# Patient Record
Sex: Female | Born: 1935 | Race: White | Hispanic: No | Smoking: Former smoker
Health system: Southern US, Community
[De-identification: ages and names within clinical notes are randomized; demographics above are authoritative.]

## PROBLEM LIST (undated history)

## (undated) DIAGNOSIS — J45909 Unspecified asthma, uncomplicated: Secondary | ICD-10-CM

## (undated) DIAGNOSIS — I10 Essential (primary) hypertension: Secondary | ICD-10-CM

## (undated) DIAGNOSIS — H3530 Unspecified macular degeneration: Secondary | ICD-10-CM

## (undated) DIAGNOSIS — M810 Age-related osteoporosis without current pathological fracture: Secondary | ICD-10-CM

## (undated) DIAGNOSIS — M1990 Unspecified osteoarthritis, unspecified site: Secondary | ICD-10-CM

## (undated) DIAGNOSIS — G20 Parkinson's disease: Secondary | ICD-10-CM

## (undated) DIAGNOSIS — N189 Chronic kidney disease, unspecified: Secondary | ICD-10-CM

## (undated) DIAGNOSIS — G510 Bell's palsy: Secondary | ICD-10-CM

## (undated) DIAGNOSIS — K760 Fatty (change of) liver, not elsewhere classified: Secondary | ICD-10-CM

## (undated) HISTORY — DX: Bell's palsy: G51.0

## (undated) HISTORY — PX: EYE SURGERY: SHX253

## (undated) HISTORY — PX: APPENDECTOMY: SHX54

## (undated) HISTORY — DX: Fatty (change of) liver, not elsewhere classified: K76.0

## (undated) HISTORY — PX: CATARACT EXTRACTION: SUR2

## (undated) HISTORY — PX: ABDOMINAL HYSTERECTOMY: SHX81

## (undated) HISTORY — PX: ROTATOR CUFF REPAIR: SHX139

## (undated) HISTORY — PX: HERNIA REPAIR: SHX51

## (undated) HISTORY — DX: Unspecified asthma, uncomplicated: J45.909

## (undated) HISTORY — DX: Unspecified macular degeneration: H35.30

## (undated) HISTORY — PX: CHOLECYSTECTOMY: SHX55

---

## 1957-07-05 DIAGNOSIS — G510 Bell's palsy: Secondary | ICD-10-CM

## 1957-07-05 HISTORY — DX: Bell's palsy: G51.0

## 1999-04-02 ENCOUNTER — Ambulatory Visit (HOSPITAL_COMMUNITY): Admission: RE | Admit: 1999-04-02 | Discharge: 1999-04-02 | Payer: Self-pay | Admitting: *Deleted

## 1999-09-23 ENCOUNTER — Encounter: Admission: RE | Admit: 1999-09-23 | Discharge: 1999-09-23 | Payer: Self-pay | Admitting: *Deleted

## 1999-09-25 ENCOUNTER — Encounter: Payer: Self-pay | Admitting: Gastroenterology

## 1999-09-25 ENCOUNTER — Inpatient Hospital Stay (HOSPITAL_COMMUNITY): Admission: EM | Admit: 1999-09-25 | Discharge: 1999-09-28 | Payer: Self-pay | Admitting: *Deleted

## 1999-09-26 ENCOUNTER — Encounter: Payer: Self-pay | Admitting: Gastroenterology

## 2000-07-25 ENCOUNTER — Other Ambulatory Visit: Admission: RE | Admit: 2000-07-25 | Discharge: 2000-07-25 | Payer: Self-pay | Admitting: *Deleted

## 2000-07-26 ENCOUNTER — Ambulatory Visit (HOSPITAL_COMMUNITY): Admission: RE | Admit: 2000-07-26 | Discharge: 2000-07-26 | Payer: Self-pay | Admitting: *Deleted

## 2000-09-15 ENCOUNTER — Ambulatory Visit (HOSPITAL_COMMUNITY): Admission: RE | Admit: 2000-09-15 | Discharge: 2000-09-15 | Payer: Self-pay | Admitting: *Deleted

## 2000-10-27 ENCOUNTER — Emergency Department (HOSPITAL_COMMUNITY): Admission: EM | Admit: 2000-10-27 | Discharge: 2000-10-27 | Payer: Self-pay | Admitting: Emergency Medicine

## 2000-10-30 ENCOUNTER — Emergency Department (HOSPITAL_COMMUNITY): Admission: EM | Admit: 2000-10-30 | Discharge: 2000-10-30 | Payer: Self-pay | Admitting: Emergency Medicine

## 2001-08-22 ENCOUNTER — Ambulatory Visit (HOSPITAL_COMMUNITY): Admission: RE | Admit: 2001-08-22 | Discharge: 2001-08-22 | Payer: Self-pay | Admitting: *Deleted

## 2001-09-12 ENCOUNTER — Encounter: Admission: RE | Admit: 2001-09-12 | Discharge: 2001-09-12 | Payer: Self-pay | Admitting: *Deleted

## 2002-01-28 ENCOUNTER — Inpatient Hospital Stay (HOSPITAL_COMMUNITY): Admission: EM | Admit: 2002-01-28 | Discharge: 2002-01-31 | Payer: Self-pay

## 2002-07-09 ENCOUNTER — Ambulatory Visit: Admission: RE | Admit: 2002-07-09 | Discharge: 2002-07-09 | Payer: Self-pay | Admitting: Internal Medicine

## 2003-10-25 ENCOUNTER — Ambulatory Visit (HOSPITAL_COMMUNITY): Admission: RE | Admit: 2003-10-25 | Discharge: 2003-10-25 | Payer: Self-pay | Admitting: Internal Medicine

## 2003-11-14 ENCOUNTER — Other Ambulatory Visit: Admission: RE | Admit: 2003-11-14 | Discharge: 2003-11-14 | Payer: Self-pay | Admitting: Internal Medicine

## 2004-01-21 ENCOUNTER — Encounter (INDEPENDENT_AMBULATORY_CARE_PROVIDER_SITE_OTHER): Payer: Self-pay | Admitting: *Deleted

## 2004-01-21 ENCOUNTER — Ambulatory Visit (HOSPITAL_COMMUNITY): Admission: RE | Admit: 2004-01-21 | Discharge: 2004-01-21 | Payer: Self-pay | Admitting: General Surgery

## 2004-12-02 ENCOUNTER — Ambulatory Visit (HOSPITAL_COMMUNITY): Admission: RE | Admit: 2004-12-02 | Discharge: 2004-12-02 | Payer: Self-pay | Admitting: Internal Medicine

## 2006-11-18 ENCOUNTER — Ambulatory Visit (HOSPITAL_COMMUNITY): Admission: RE | Admit: 2006-11-18 | Discharge: 2006-11-18 | Payer: Self-pay | Admitting: Family Medicine

## 2007-06-28 ENCOUNTER — Ambulatory Visit (HOSPITAL_COMMUNITY): Admission: RE | Admit: 2007-06-28 | Discharge: 2007-06-28 | Payer: Self-pay | Admitting: Family Medicine

## 2007-07-10 ENCOUNTER — Ambulatory Visit (HOSPITAL_COMMUNITY): Admission: RE | Admit: 2007-07-10 | Discharge: 2007-07-10 | Payer: Self-pay | Admitting: Family Medicine

## 2007-09-12 ENCOUNTER — Ambulatory Visit (HOSPITAL_COMMUNITY): Admission: RE | Admit: 2007-09-12 | Discharge: 2007-09-13 | Payer: Self-pay | Admitting: General Surgery

## 2007-09-12 ENCOUNTER — Encounter (INDEPENDENT_AMBULATORY_CARE_PROVIDER_SITE_OTHER): Payer: Self-pay | Admitting: General Surgery

## 2008-02-22 ENCOUNTER — Emergency Department (HOSPITAL_COMMUNITY): Admission: EM | Admit: 2008-02-22 | Discharge: 2008-02-22 | Payer: Self-pay | Admitting: Emergency Medicine

## 2009-05-27 ENCOUNTER — Ambulatory Visit (HOSPITAL_COMMUNITY): Admission: RE | Admit: 2009-05-27 | Discharge: 2009-05-27 | Payer: Self-pay | Admitting: Family Medicine

## 2009-07-12 ENCOUNTER — Emergency Department (HOSPITAL_COMMUNITY): Admission: EM | Admit: 2009-07-12 | Discharge: 2009-07-12 | Payer: Self-pay | Admitting: Emergency Medicine

## 2010-10-07 ENCOUNTER — Encounter (HOSPITAL_COMMUNITY): Payer: Medicare Other

## 2010-11-17 NOTE — Op Note (Signed)
NAME:  Patricia Hopkins, Patricia Hopkins           ACCOUNT NO.:  [id number]   MEDICAL RECORD NO.:  [id number]          PATIENT TYPE:  OIB   LOCATION:  1535                         FACILITY:  [facility]   PHYSICIAN:  Adolph Pollack, M.D.DATE OF BIRTH:  [birth year]   DATE OF PROCEDURE:  09/12/2007  DATE OF DISCHARGE:  09/13/2007                               OPERATIVE REPORT   PREOPERATIVE DIAGNOSIS:  Biliary akinesia.   POSTOPERATIVE DIAGNOSES:  1. Biliary akinesia.  2. Hepatic fibrotic changes.   OPERATION/PROCEDURE:  1. Laparoscopic cholecystectomy.  2. Intraoperative cholangiogram.  3. Tru-Cut liver biopsy.   SURGEON:  Adolph Pollack, M.D.   ASSISTANT:  Anselm Pancoast. Zachery Dakins, M.D.   ANESTHESIA:  General.   INDICATIONS:  Mrs. Roundy is a 77 year old female who has had  intermittent episodes of right upper quadrant crampy pain radiating  around to the subscapular region after eating.  It seemed to occur more  after fatty or spicy foods.  She underwent an ultrasound which did not  demonstrate gallstones and she has a mildly dilated common bile duct,  but in 2003 she had an ultrasound.  This was also noted and unchanged.  She has some findings consistent with a fatty liver.  She has biliary  dyskinesia on her nuclear medicine gallbladder scan and thus presents  for elective cholecystectomy.  We have discussed the procedure, risks  and aftercare preoperatively.   TECHNIQUE:  She is seen in the holding area and brought to the operating  room, placed supine on the operating table and general anesthetic was  administered.  Her abdominal wall was sterilely prepped and draped.  Because of a previous umbilical hernia repair and lower midline  incision, a supraumbilical epigastric incision was made through the skin  and subcutaneous tissue and midline fascia.  Peritoneal cavity was then  entered under direct vision.  A Hassan trocar was introduced in the  peritoneal cavity and pneumoperitoneum  was created by insufflation of  CO2 gas.   Next, a 11 mm trocar was placed through an epigastric incision just  below the xiphoid process.  Two 5 mm trocars were placed in the right  upper quadrant.  She was placed in reverse Trendelenburg position, right  side tilted up.  On inspection of the liver, it had a micro nodular  appearance which appeared to be stiff and fibrotic.  The fundus of the  gallbladder was grasped and retracted toward the right shoulder.  There  were no inflammatory changes noted.  Using careful blunt dissection, I  began by mobilizing the infundibulum and retracted it laterally.  I  isolated the anterior branch of the cystic artery and created a window  around it.  It was clipped and divided.  I could then visualize the  cystic duct.  Using blunt dissection, I created a window around it.  She  had some abnormality of SGOT and SGPT.  A clip was placed at the  gallbladder cystic duct junction and small incision made in the cystic  duct.  Cholangiocatheter was passed through the anterior abdominal wall,  placed into the cystic duct and cholangiogram was performed.  Under real time fluoroscopy,  local contrast was injected into the  cystic duct which was of moderate to short length.  The common hepatic,  right and left hepatic, common bile ducts all filled.  Contrast drained  into the duodenum.  Common bile duct did appear to be slightly generous.  I did not see an obvious filling defect in the final reports pending  radiologist interpretation.   The cholangiocath was removed, the cystic duct was clipped three times  and staying inside divided.  Using blunt dissection, identified the  posterior branch of the cystic artery, created a window around it.  It  was clipped and divided.  I then dissected the gallbladder free from the  liver using electrocautery and placed it in an Endopouch bag.   Following this, I used the same small incision I used for the   cholangiocatheter, passed the Tru-Cut needle into the right lobe of  liver and perform a liver biopsy.  Sent this out to the pathology.  Bleeding was controlled electrocautery and piece of Surgicel was placed  over the site.   The gallbladder fossa was then copiously irrigated and no bile leak was  noted.   Then placed a piece of Surgicel on the gallbladder fossa.  The  gallbladder was then removed and the Endopouch bag through the inferior  epigastric port.  The fascial defect was closed by tightening up and  tying down the pursestring suture under laparoscopic vision.  The  remaining trocars were removed and pneumoperitoneum was released.   All skin incisions were closed with 4-0 Monocryl subcuticular stitches  followed by Steri-Strips and sterile dressings.  She tolerated procedure  without apparent complications was taken to recovery in satisfactory  condition.      Adolph Pollack, M.D.  Electronically Signed     TJR/MEDQ  D:  09/12/2007  T:  09/13/2007  Job:  [id number]   cc:   Stacie Acres. Cliffton Asters, M.D.  Fax: [phone]

## 2010-11-20 NOTE — Cardiovascular Report (Signed)
   NAME:  Patricia Hopkins, Patricia Hopkins                     ACCOUNT NO.:  [id number]   MEDICAL RECORD NO.:  [id number]                   PATIENT TYPE:  INP   LOCATION:  3738                                 FACILITY:  [facility]   PHYSICIAN:  Colleen Can. Deborah Chalk, M.D.            DATE OF BIRTH:  [birth year]   DATE OF PROCEDURE:  DATE OF DISCHARGE:  01/31/2002                              CARDIAC CATHETERIZATION   HISTORY:  [name] is a 77 year old female with a history of polycythemia  and obesity.  She presents with chest pain.  She has had asthma,  hypertension, and polycythemia.   PROCEDURE:  Left heart catheterization with selective coronary angiography.   TYPE AND SITE OF ENTRY:  Percutaneous subsequently noted to be in the  superficial femoral artery, Perclose not able to be performed.   CONTRAST:  Omnipaque.   MEDICATIONS GIVEN PRIOR TO PROCEDURE:  Valium 10 mg p.o.   MEDICATIONS GIVEN DURING PROCEDURE:  Versed 2 mg IV.   COMMENTS:  The patient tolerated the procedure well.   HEMODYNAMIC DATA:  The aortic pressure was 154/86, LV was 154/7.  There was  ventricular tachycardia noted at the time of pullback.  There was no aortic  valve gradient present during the case.   ANGIOGRAPHIC DATA:  1. The left main coronary artery is normal.  2. The left anterior descending has irregularities but is essentially     normal.  3. The intermediate coronary artery is normal.  4. Left circumflex:  Moderate-size vessel.  It is normal.  5. The right coronary artery is normal.   LEFT VENTRICULAR ANGIOGRAM:  The left ventricular angiogram is performed in  an RAO position.  Overall cardiac size and silhouette were normal.  Somewhat  hyperdynamic LV noted even prior to developing a short run of ventricular  tachycardia, which was catheter-induced.    OVERALL IMPRESSION:  1. Normal left ventricular function.  2. Minimal coronary atherosclerosis and essentially normal coronary      arteries.                                                 Colleen Can. Deborah Chalk, M.D.    SNT/MEDQ  D:  01/30/2002  T:  02/05/2002  Job:  [id number]   cc:   Pearletha Alfred, M.D.   Soyla Murphy. Renne Crigler, M.D.  Fax: [phone]

## 2010-11-20 NOTE — Discharge Summary (Signed)
NAME:  Patricia Hopkins, Patricia Hopkins                     ACCOUNT NO.:  [id number]   MEDICAL RECORD NO.:  [id number]                   PATIENT TYPE:  INP   LOCATION:  3738                                 FACILITY:  [facility]   PHYSICIAN:  Georgianne Fick, M.D.            DATE OF BIRTH:   DATE OF ADMISSION:  01/28/2002  DATE OF DISCHARGE:  01/31/2002                                 DISCHARGE SUMMARY   ADMISSION DIAGNOSES:  1. Chest pain.  2. Hyponatremia.  3. Probable sleep apnea.  4. Sunburn.  5. Rosacea.  6. Asthma.   DISCHARGE DIAGNOSES:  1. Chest pain/pressure, noncardiac in origin.  2. Asthmatic bronchitis with exacerbation.  3. Chronic issues, stable.   HOSPITAL COURSE:  After being admitted to [facility]  with complaints of chest pressure for approximately two days, the patient  has been free from an chest pain during her stay here.  She ruled out with  enzymes and had a cardiac catheterization carried out by [employer]. Deborah Chalk,  M.D.  This revealed minor coronary artery disease and as a result she will  be discharge home.   During her stay, she had worsening of her asthmatic bronchitis.  She was  started on treatment using albuterol/Atrovent nebulizers.  With this she  obtained remarkable improvement of her symptoms and thus will be discharged  on the nebulizer for home use.   LABORATORY DATA:  At the time of discharge, the comprehensive metabolic  panel revealed a sodium of 138, a potassium of 3.4, a BUN of 7, and a  creatinine of 0.7.  Liver function tests were grossly normal.  The CBC drawn  on January 30, 2002, revealed a white count of 8.0 with a hemoglobin of 15.1  and a platelet count of 231.   RADIOLOGICAL INVESTIGATIONS:  The chest x-ray taken on admission revealed  remote right-sided rib fracture with upper limits of heart size and mild  pulmonary vascular congestion.   PROCEDURES:  Cardiac catheterization carried out on January 30, 2002.  This  revealed normal LV function.  The only abnormality noted was LAD with  irregular minor stenosis.   DISCHARGE MEDICATIONS:  1. The patient will be discharged on all preadmission medications.  2. In addition, she will start nebulizer treatments using premixed     albuterol, as well as Atrovent t.i.d., as well as p.r.n. q.6h.  3. She is going to be taking Protonix 40 mg p.o. q.d.  4. Aveeno anti-itch cream to allergic rash on the chest plus over the count     Cortate.    DIET:  Low-fat, low-salt diet.   FOLLOW-UP:  The patient will follow up with myself in approximately four  weeks' time.  Georgianne Fick, M.D.    AR/MEDQ  D:  01/31/2002  T:  02/05/2002  Job:  [id number]

## 2010-11-20 NOTE — H&P (Signed)
NAME:  Patricia Hopkins, PHENIX                     ACCOUNT NO.:  [id number]   MEDICAL RECORD NO.:  [id number]                   PATIENT TYPE:  INP   LOCATION:  3738                                 FACILITY:  [facility]   PHYSICIAN:  Soyla Murphy. Renne Crigler, M.D.               DATE OF BIRTH:  January 16, 1932   DATE OF ADMISSION:  01/28/2002  DATE OF DISCHARGE:                                HISTORY & PHYSICAL   REASON FOR ADMISSION:  The patient is a 77 year old married white resident  of [place], who comes in with a chief complaint of chest pain.  She  describes two types of pain.  The first was sharp, intermittent, and began  about 11 a.m. on 7/27.  It occurred at rest, lasting a few seconds each.  About 8 p.m. tonight she developed chest pressure and shortness of breath  associated with clamminess and nausea.  Her husband brought her to the  emergency room.  She only has some slight chest pressure remaining.  Dr.  Deborah Chalk did a cardiac catheterization on her 12/95 with normal coronaries.  Again, the discomfort tonight occurred at rest.   PAST MEDICAL HISTORY:  Polycythemia followed by Dr. Donnie Coffin, rosacea, asthma,  and hypertension.   MEDICATIONS:  Albuterol two puffs four times a day p.r.n., HCTZ 25 mg p.o.  daily, fish oil capsules daily, aspirin 325 mg p.o. daily, Flonase one  squirt each nostril daily, doxycycline 100 mg daily p.r.n. rosacea.   ALLERGIES:  PENICILLIN, VALIUM, IBUPROFEN.  Intolerance to MORPHINE,  DEMEROL, IV DYE.   FAMILY HISTORY:  She has five children, alive and well.  Both parents had  coronary artery disease.   PERSONAL HISTORY:  She is retired.  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  No alcohol or  tobacco use.   REVIEW OF SYSTEMS:  She has had no skin changes.  She has occasional  headaches with intermittent left foot tingling over the past year, minimal.  Prior to today she has not had any chest pain,  shortness of breath,  orthopnea, PND.  She has a chronic intermittent edema that is no worse than  usual now.  There is no change in vision or hearing recently.  No sinus  pain, ear pain, sore throat, or runny nose.  She has had no recent cough or  palpitations.  No wheezing.  There is no hemoptysis, trouble swallowing,  nausea, vomiting, diarrhea, or rectal bleeding.  No urgency, frequency,  dysuria, or hematuria.  No vaginal bleeding or discharge.  She has had a  colonoscopy in the last couple of months.  No joint pain.  She tends to be  hotter than the average person and is not depressed.  She does snore loudly  and stops breathing periodically at night.   PHYSICAL EXAMINATION:  GENERAL:  She is comfortable-appearing, in no acute  distress.  VITAL  SIGNS:  Initial blood pressure is 206/87 with pulse of 91,  respirations 22, and O2 saturation of 97%.  Temperature is 98.4.  SKIN:  Redness of nose.  She has sunburn of the left shoulder.  LYMPHATIC:  There is no cervical, supraclavicular, axillary, or inguinal  adenopathy.  HEENT:  Normocephalic, atraumatic.  Conjunctivae are normal.  Pupils are  equal and reactive to light.  Normal tongue and posterior pharynx.  Normal  mucous membranes.  TMs are normal.  NECK:  Supple.  There is no thyromegaly or neck masses.  No carotid bruits.  CHEST:  Lungs were clear to auscultation.  PULSES:  2+ equal carotid, radial, dorsalis pedis, and 1+ equal posterior  tibial pulses bilaterally.  CARDIAC:  Regular rhythm.  No murmurs, rubs, or gallops.  No JVD, no edema.  ABDOMEN:  Nontender, bowel sounds present, no organomegaly, no masses.  BREASTS, PELVIC:  Not performed, declined by patient.  RECTAL:  She had agreed to a rectal exam and that showed normal rectal tone,  stool brown and heme-negative.  NEUROLOGIC:  She is alert and oriented x3.  Cranial nerves II-XII intact  except she is unable to abduct her right eye fully.  Normal speech.  No  tremors.   Gait is not tested.   IMPRESSION:  1. Chest pain, rule out myocardial infarction.  I think it is more likely     related to her asthma.  I will have Dr. Deborah Chalk see her again during the     hospital stay.  2. Asthma.  3. Hypertension, inadequate control.  Will add a second medication, Diovan,     to her regimen.  I am hesitant to give her a beta blocker given her     asthma.  4. Rosacea.  5. Sleep apnea by history.  She does have a positive family history of this     and probably will need a sleep study as an outpatient.  6. Polycythemia, followed by Dr. Donnie Coffin.  It is possible that this may also     improve with treatment of sleep apnea.  7. Allergic rhinitis.  8. Multiple allergies, see above.  9. Multiple medication intolerances, see above.                                               Soyla Murphy. Renne Crigler, M.D.    WDP/MEDQ  D:  01/29/2002  T:  01/31/2002  Job:  [id number]   cc:   Colleen Can. Deborah Chalk, M.D.   Georgianne Fick, M.D.  Fax: [phone]   Pierce Crane, M.D.

## 2010-11-20 NOTE — Procedures (Signed)
[facility]  Patient:    Patricia Hopkins, Patricia Hopkins                  MRN: [id number] Proc. Date: 09/15/00 Adm. Date:  [id number] Attending:  Sabino Gasser                           Procedure Report  PROCEDURE:  Colonoscopy.  INDICATION FOR PROCEDURE:  Colon polyps, constipation and colon cancer screening.  ANESTHESIA:  Demerol 120 mg, no Versed given.  DESCRIPTION OF PROCEDURE:  With the patient mildly sedated in the left lateral decubitus position, the Olympus videoscopic colonoscope was inserted in the rectum and passed under direct vision to the cecum identified by the ileocecal valve and appendiceal orifice both of which were photographed. From this point, the colonoscope was slowly withdrawn taking circumferential views of the entire colonic mucosa, stopping in the sigmoid area approximately 25 cm from the anal verge at which point a number of small polyps were seen, photographed and removed using hot biopsy forceps technique. The endoscope was withdrawn to the rectum which appeared normal in direct and retroflexed views. The endoscope was straightened and withdrawn. The patients vital signs and pulse oximeter remained stable. The patient tolerated the procedure well without apparent complications.  FINDINGS:  Small polyps of rectosigmoid area approximately 20-25 cm from the anal verge. Await biopsy report. The patient will call me for results and follow-up with me as an outpatient for this and other medical problems. DD:  09/15/00 TD:  09/15/00 Job: [id number]

## 2010-11-20 NOTE — Op Note (Signed)
NAME:  Patricia Hopkins, Patricia Hopkins                     ACCOUNT NO.:  [id number]   MEDICAL RECORD NO.:  [id number]                   PATIENT TYPE:  OIB   LOCATION:  2899                                 FACILITY:  [facility]   PHYSICIAN:  Adolph Pollack, M.D.            DATE OF BIRTH:  06-04-1932   DATE OF PROCEDURE:  01/21/2004  DATE OF DISCHARGE:                                 OPERATIVE REPORT   PREOPERATIVE DIAGNOSIS:  Umbilical hernia.   POSTOPERATIVE DIAGNOSIS:  Umbilical hernia.   PROCEDURE:  Umbilical hernia repair with mesh.   SURGEON:  Adolph Pollack, M.D.   ANESTHESIA:  General anesthesia.   INDICATIONS FOR PROCEDURE:  This 77 year old female has noted a bulge in the  umbilicus for some time but now she noticed it is starting to get larger and  becoming symptomatic.  She says sometimes it gets very protuberant and the  skin gets purple and then it goes down spontaneously.  She now presents for  elective repair.  The procedure and the risks were discussed with her  preoperatively.   DESCRIPTION OF PROCEDURE:  She was placed supine on the operating table and  general anesthetic was administered.  The periumbilical area was sterilely  prepped and draped.  There was 0.5% plain Marcaine used for local  anesthetic.  This was infiltrated in the periumbilical region.  A  curvilinear incision was made inferior to the umbilicus and carried down to  the subcutaneous tissue until the fascia was identified.  I then isolated  the umbilical area and the umbilical stalk.  I excised the hernia sac and  released the umbilicus from the fascia, exposing the defect.  I dissected  the subcutaneous tissue off the fascia for 3 to 4 cm around the defect.  I  excised some of the hernia sac.  I then primarily closed the defect with  interrupted 0 Surgilon sutures.  Subsequently a piece of polypropylene mesh  was placed as an onlay over the repair and the sutures used for the primary  repair were  threaded up toward it and tied down, anchoring the mesh to the  repair.  The periphery of the mesh was then anchored to the fascia with a  running 2-0 Vicryl suture. This provided for more than adequate coverage of  the defect as well as 3 to 4 cm of overlap.   I noted hemostasis was adequate.  I then injected the 0.5% plain Marcaine  local anesthetic into the fascial area.  I reattached the umbilicus to the  fascia with a single 3-0 Vicryl suture. The subcutaneous tissue was closed  over the fascia with a running 3-0 Vicryl suture.  The skin was closed with  a 4-0 Monocryl subcuticular stitch.  Steri-Strips and sterile dressings were  applied.   She tolerated the procedure well without any apparent complications.  She  subsequently was taken to the recovery room in satisfactory condition.  The  plan  will be to send her home later today with activity restrictions and  pain medication and see her back in the office in two to three weeks.                                               Adolph Pollack, M.D.    Kari Baars  D:  01/21/2004  T:  01/21/2004  Job:  [id number]   cc:   Georgianne Fick, M.D.  [address]  [place]  [place] [address]  Fax: [phone]

## 2011-03-29 LAB — COMPREHENSIVE METABOLIC PANEL
Albumin: 4.1
Alkaline Phosphatase: 67
BUN: 22
Chloride: 99
GFR calc Af Amer: >60
Glucose, Bld: 104 — ABNORMAL HIGH
Potassium: 3.8
Sodium: 136
Total Bilirubin: 0.8

## 2011-03-29 LAB — DIFFERENTIAL
Lymphocytes Relative: 33
Lymphs Abs: 2.4
Monocytes Relative: 10
Neutro Abs: 4
Neutrophils Relative %: 55

## 2011-03-29 LAB — CBC
HCT: 45.5
Hemoglobin: 15.7 — ABNORMAL HIGH
Platelets: 286
WBC: 7.4

## 2011-03-29 LAB — PROTIME-INR: Prothrombin Time: 13.4

## 2012-02-02 ENCOUNTER — Other Ambulatory Visit: Payer: Self-pay | Admitting: Family Medicine

## 2012-02-03 ENCOUNTER — Other Ambulatory Visit (HOSPITAL_COMMUNITY): Payer: Self-pay | Admitting: General Practice

## 2012-02-03 ENCOUNTER — Other Ambulatory Visit (HOSPITAL_COMMUNITY): Payer: Self-pay | Admitting: *Deleted

## 2012-02-11 ENCOUNTER — Other Ambulatory Visit (HOSPITAL_COMMUNITY): Payer: Self-pay | Admitting: *Deleted

## 2012-02-15 ENCOUNTER — Encounter (HOSPITAL_COMMUNITY): Payer: Self-pay

## 2012-02-15 ENCOUNTER — Encounter (HOSPITAL_COMMUNITY)
Admission: RE | Admit: 2012-02-15 | Discharge: 2012-02-15 | Disposition: A | Discharge status: Home / Self Care | Payer: Medicare Other | Source: Ambulatory Visit | Attending: Family Medicine | Admitting: Family Medicine

## 2012-02-15 DIAGNOSIS — M810 Age-related osteoporosis without current pathological fracture: Secondary | ICD-10-CM | POA: Insufficient documentation

## 2012-02-15 HISTORY — DX: Essential (primary) hypertension: I10

## 2012-02-15 HISTORY — DX: Age-related osteoporosis without current pathological fracture: M81.0

## 2012-02-15 HISTORY — DX: Chronic kidney disease, unspecified: N18.9

## 2012-02-15 HISTORY — DX: Unspecified osteoarthritis, unspecified site: M19.90

## 2012-02-15 MED ORDER — ZOLEDRONIC ACID 5 MG/100ML IV SOLN
5.0000 mg | Freq: Once | INTRAVENOUS | Status: AC
  Administered 2012-02-15: 5 mg via INTRAVENOUS
  Filled 2012-02-15: qty 100

## 2012-02-15 MED ORDER — SODIUM CHLORIDE 0.9 % IV SOLN: Freq: Once | INTRAVENOUS | Status: DC

## 2013-09-18 ENCOUNTER — Encounter (HOSPITAL_COMMUNITY): Payer: Self-pay | Admitting: Emergency Medicine

## 2013-09-18 ENCOUNTER — Emergency Department (HOSPITAL_COMMUNITY)
Admission: EM | Admit: 2013-09-18 | Discharge: 2013-09-18 | Disposition: A | Discharge status: Home / Self Care | Payer: Medicare Other | Source: Home / Self Care | Attending: Family Medicine | Admitting: Family Medicine

## 2013-09-18 DIAGNOSIS — R112 Nausea with vomiting, unspecified: Secondary | ICD-10-CM

## 2013-09-18 DIAGNOSIS — R197 Diarrhea, unspecified: Secondary | ICD-10-CM

## 2013-09-18 LAB — POCT I-STAT, CHEM 8
BUN: 42 mg/dL — AB (ref 6–23)
CALCIUM ION: 1.08 mmol/L — AB (ref 1.13–1.30)
Chloride: 105 mEq/L (ref 96–112)
Creatinine, Ser: 0.8 mg/dL (ref 0.50–1.10)
Glucose, Bld: 107 mg/dL — ABNORMAL HIGH (ref 70–99)
HCT: 50 % — ABNORMAL HIGH (ref 36.0–46.0)
Hemoglobin: 17 g/dL — ABNORMAL HIGH (ref 12.0–15.0)
Potassium: 3.5 mEq/L — ABNORMAL LOW (ref 3.7–5.3)
Sodium: 141 mEq/L (ref 137–147)
TCO2: 21 mmol/L (ref 0–100)

## 2013-09-18 MED ORDER — ONDANSETRON 4 MG PO TBDP
ORAL_TABLET | ORAL | Status: AC
  Filled 2013-09-18: qty 1

## 2013-09-18 MED ORDER — SODIUM CHLORIDE 0.9 % IV BOLUS (SEPSIS)
1000.0000 mL | Freq: Once | INTRAVENOUS | Status: AC
  Administered 2013-09-18: 1000 mL via INTRAVENOUS

## 2013-09-18 MED ORDER — ONDANSETRON 4 MG PO TBDP
8.0000 mg | ORAL_TABLET | Freq: Once | ORAL | Status: AC
  Administered 2013-09-18: 8 mg via ORAL

## 2013-09-18 MED ORDER — ONDANSETRON 4 MG PO TBDP: 4.0000 mg | ORAL_TABLET | Freq: Three times a day (TID) | ORAL | Status: DC | PRN

## 2013-09-18 NOTE — ED Notes (Signed)
Tolerating coke and chips.

## 2013-09-18 NOTE — ED Notes (Signed)
Sipping coke/ice chips

## 2013-09-18 NOTE — Discharge Instructions (Signed)
Nausea and Vomiting °Nausea means you feel sick to your stomach. Throwing up (vomiting) is a reflex where stomach contents come out of your mouth. °HOME CARE  °· Take medicine as told by your doctor. °· Do not force yourself to eat. However, you do need to drink fluids. °· If you feel like eating, eat a normal diet as told by your doctor. °· Eat rice, wheat, potatoes, bread, lean meats, yogurt, fruits, and vegetables. °· Avoid high-fat foods. °· Drink enough fluids to keep your pee (urine) clear or pale yellow. °· Ask your doctor how to replace body fluid losses (rehydrate). Signs of body fluid loss (dehydration) include: °· Feeling very thirsty. °· Dry lips and mouth. °· Feeling dizzy. °· Dark pee. °· Peeing less than normal. °· Feeling confused. °· Fast breathing or heart rate. °GET HELP RIGHT AWAY IF:  °· You have blood in your throw up. °· You have black or bloody poop (stool). °· You have a bad headache or stiff neck. °· You feel confused. °· You have bad belly (abdominal) pain. °· You have chest pain or trouble breathing. °· You do not pee at least once every 8 hours. °· You have cold, clammy skin. °· You keep throwing up after 24 to 48 hours. °· You have a fever. °MAKE SURE YOU:  °· Understand these instructions. °· Will watch your condition. °· Will get help right away if you are not doing well or get worse. °Document Released: 12/08/2007 Document Revised: 09/13/2011 Document Reviewed: 11/20/2010 °[employer]® Patient Information ©2014 [employer]. ° °

## 2013-09-18 NOTE — ED Provider Notes (Signed)
CSN: [id number]     Arrival date & time 09/18/13  1552 History   First MD Initiated Contact with Patient 09/18/13 1747     Chief Complaint  Patient presents with  . Diarrhea  . Emesis   (Consider location/radiation/quality/duration/timing/severity/associated sxs/prior Treatment) HPI  Patient is a 80 yo F with PMH of osteoporosis, HTN, CKD and astham presenting with 1 day history of vomiting and diarrhea. Started with nausea yesterday and then vomiting/diarrhea started at 5:00pm yesterday. [place]. She continues to have nausea with dry heaving. She is having watery diarrhea but it is slowing down. She is not eating or drinking. No bloody stool or emesis. She denies any sick contacts. She has not had any unusual foods except her breakfast at [employer] yesterday morning looked unusual. No recent travel. No new medications or antibiotics. No fevers. Increased fatigue. Denies abdominal pain, no body aches.   Past Medical History  Diagnosis Date  . Hypertension   . Chronic kidney disease   . Arthritis   . Osteoporosis    Past Surgical History  Procedure Laterality Date  . Abdominal hysterectomy    . Cholecystectomy    . Appendectomy    . Hernia repair    . Eye surgery    . Left rotator cuff repair     History reviewed. No pertinent family history. History  Substance Use Topics  . Smoking status: Never Smoker   . Smokeless tobacco: Former NeurosurgeonUser    Quit date: 08/04/1976  . Alcohol Use: No   OB History   Grav Para Term Preterm Abortions TAB SAB Ect Mult Living                 Review of Systems  Constitutional: Negative for fever and chills.  HENT: Negative for congestion.   Eyes: Negative for visual disturbance.  Respiratory: Negative for cough and shortness of breath.   Cardiovascular: Negative for chest pain and leg swelling.  Gastrointestinal: Positive for nausea, vomiting and diarrhea. Negative for abdominal pain.   Genitourinary: Negative for dysuria.  Musculoskeletal: Negative for arthralgias and myalgias.  Skin: Negative for rash.  Neurological: Negative for headaches.    Allergies  Ibuprofen; Cepacol; Crestor; Dual action complete; Fosamax; Penicillins; Pravachol; Valium; and Zocor  Home Medications   Current Outpatient Rx  Name  Route  Sig  Dispense  Refill  . albuterol (PROVENTIL) (2.5 MG/3ML) 0.083% nebulizer solution   Nebulization   Take 2.5 mg by nebulization every 6 (six) hours as needed.         Marland Kitchen. aspirin 81 MG tablet   Oral   Take 81 mg by mouth daily.         Marland Kitchen. ezetimibe (ZETIA) 10 MG tablet   Oral   Take 5 mg by mouth daily.         Marland Kitchen. ipratropium (ATROVENT) 0.02 % nebulizer solution   Nebulization   Take 500 mcg by nebulization 4 (four) times daily.         . metoprolol (LOPRESSOR) 50 MG tablet   Oral   Take 50 mg by mouth 2 (two) times daily.         . naproxen sodium (ANAPROX) 220 MG tablet   Oral   Take 220 mg by mouth 2 (two) times daily with a meal.         . nystatin (MYCOSTATIN/NYSTOP) 100000 UNIT/GM POWD   Topical   Apply topically.         .Marland Kitchen  ondansetron (ZOFRAN ODT) 4 MG disintegrating tablet   Oral   Take 1 tablet (4 mg total) by mouth every 8 (eight) hours as needed for nausea or vomiting.   20 tablet   0   . triamterene-hydrochlorothiazide (MAXZIDE-25) 37.5-25 MG per tablet   Oral   Take 1 tablet by mouth daily.         . zoledronic acid (RECLAST) 5 MG/100ML SOLN   Intravenous   Inject 5 mg into the vein once.          BP 158/96  Pulse 116  Temp(Src) 98.9 F (37.2 C) (Oral)  Resp 18  SpO2 96% Physical Exam  Constitutional: She is oriented to person, place, and time. She appears well-developed and well-nourished.  Appears uncomfortable  HENT:  Head: Normocephalic and atraumatic.  Tachy mucus membranes but tongue is not dry  Eyes: Pupils are equal, round, and reactive to light.  Neck: Normal range of motion. Neck  supple.  Cardiovascular: Normal rate, regular rhythm and normal heart sounds.   No murmur heard. Pulmonary/Chest: Effort normal. She has wheezes (In all lung fields).  Abdominal: Soft. She exhibits no distension. There is no tenderness.  Musculoskeletal: Normal range of motion. She exhibits no edema and no tenderness.  Neurological: She is alert and oriented to person, place, and time.  Skin: Skin is warm and dry. No rash noted. There is pallor.  Psychiatric: She has a normal mood and affect.    ED Course  Procedures (including critical care time) Labs Review Labs Reviewed  POCT I-STAT, CHEM 8 - Abnormal; Notable for the following:    Potassium 3.5 (*)    BUN 42 (*)    Glucose, Bld 107 (*)    Calcium, Ion 1.08 (*)    Hemoglobin 17.0 (*)    HCT 50.0 (*)    All other components within normal limits  I-STAT CHEM 8, ED   Imaging Review No results found.   MDM   1. Nausea vomiting and diarrhea   Viral vs. Ingestion.   7:50 PM Reviewed options with patient and family about going home with symptomatic care, receiving 1L bolus here vs going to ED for further work up and fluids. They have decided to try 1L bolus here and Zofran to see if she feels better. Zofran and IV fluids ordered. Will check iStat labs with IV start.  7:50 PM Patient currently receiving bolus. Improving symptomatically after zofran and IVF. Reviewed labs with her. Her Creat is normal. Her K+ is slightly low, likely due to GI loses. Her HgB is 17, but patient reports a history of polycythemia. Continue remainder of bolus, then reassess and do PO trial.   7:50 PM Much improved after 1L bolus and zofran. She is tolerating ice chips without nausea, and passed a PO challenge with liquids. Will d/c home in stable condition. Given Rx for Zofran 4mg  tabs. F/u with PCP, or return if she fails to improve.  Hilarie Fredrickson, MD 09/18/13 1950

## 2013-09-18 NOTE — ED Notes (Signed)
Diarrhea and vomiting that started last night around 6:00pm on 3/16.  Patient has been unable to hold anything down, and if vomiting, she was having a diarrhea stool at the same time.  Reports vomiting and diarrhea have slowed and in smaller amounts.  Reports currently with dry heaves

## 2013-09-19 NOTE — ED Provider Notes (Signed)
Medical screening examination/treatment/procedure(s) were performed by a resident physician or non-physician practitioner and as the supervising physician I was immediately available for consultation/collaboration.  Clementeen GrahamEvan Jinny Sweetland, MD    Rodolph BongEvan S Makhiya Coburn, MD 09/19/13 [phone]

## 2014-10-31 ENCOUNTER — Other Ambulatory Visit: Payer: Self-pay | Admitting: Sports Medicine

## 2014-10-31 DIAGNOSIS — M545 Low back pain: Secondary | ICD-10-CM

## 2014-11-10 ENCOUNTER — Ambulatory Visit
Admission: RE | Admit: 2014-11-10 | Discharge: 2014-11-10 | Disposition: A | Discharge status: Home / Self Care | Payer: Medicare Other | Source: Ambulatory Visit | Attending: Sports Medicine | Admitting: Sports Medicine

## 2014-11-10 DIAGNOSIS — M545 Low back pain: Secondary | ICD-10-CM

## 2015-03-28 ENCOUNTER — Ambulatory Visit
Admission: RE | Admit: 2015-03-28 | Discharge: 2015-03-28 | Disposition: A | Discharge status: Home / Self Care | Payer: Medicare Other | Source: Ambulatory Visit | Attending: Family Medicine | Admitting: Family Medicine

## 2015-03-28 ENCOUNTER — Other Ambulatory Visit: Payer: Self-pay | Admitting: Family Medicine

## 2015-03-28 DIAGNOSIS — M25552 Pain in left hip: Secondary | ICD-10-CM

## 2015-10-10 ENCOUNTER — Ambulatory Visit (INDEPENDENT_AMBULATORY_CARE_PROVIDER_SITE_OTHER): Payer: Medicare Other | Admitting: Neurology

## 2015-10-10 ENCOUNTER — Encounter: Payer: Self-pay | Admitting: Neurology

## 2015-10-10 ENCOUNTER — Ambulatory Visit: Payer: Medicare Other | Admitting: Neurology

## 2015-10-10 VITALS — BP 126/80 | HR 92 | Ht 60.0 in | Wt 114.0 lb

## 2015-10-10 DIAGNOSIS — G20 Parkinson's disease: Secondary | ICD-10-CM | POA: Diagnosis not present

## 2015-10-10 MED ORDER — CARBIDOPA-LEVODOPA 25-100 MG PO TABS: 1.0000 | ORAL_TABLET | Freq: Three times a day (TID) | ORAL | Status: DC

## 2015-10-10 NOTE — Patient Instructions (Addendum)
1. Start Carbidopa Levodopa as follows: 1/2 tab three times a day before meals x 1 wk, then 1/2 in am & noon & 1 in evening for a week, then 1/2 in am &1 at noon &one in evening for a week, then 1 tablet three times a day before meals. 2. Stop Atenolol

## 2015-10-10 NOTE — Progress Notes (Signed)
Patricia Hopkins was seen today in the movement disorders clinic for neurologic consultation at the request of WILLARD,JENNIFER, PA-C.  The consultation is for the evaluation of tremor of the face and hands and to rule out Parkinson's disease.  Tremor started in the mouth about 3 years ago and the hands started at the same time.  Pt insists that the R hand started before the L but the daughter states that both hands started at the same time.  They went to the doctor and were told that they were just "tremor."  The patient's sister has Parkinson's disease and she was worried about this.  When she was seen last week by the P.A., the daughter states that her provider was worried about ?cogwheel.  Tremor is noted both at rest and with use.  Specific Symptoms:  Tremor: Yes.  , as above.  Her primary care physician started her on atenolol, 25 mg, on 3/28.  May have started to help.  Affected by caffeine: unknown (2 cups coffee per day)  Affected by alcohol:  Unknown, doesn't drink alcohol  Affected by stress:  Yes.    Affected by fatigue:  Yes.    Spills soup if on spoon:  Yes.    Spills glass of liquid if full:  Yes.    Affects ADL's (tying shoes, brushing teeth, etc):  No.   Family hx of similar:  Yes.  , sister has PD Voice: hoarse voice Sleep: sleeps well per pt but daughter denies this  Vivid Dreams:  No.  Acting out dreams:  No. Wet Pillows: Yes.   Postural symptoms:  Yes.    Falls?  Yes.   (2 total falls, the last was a year ago - now uses walker) Bradykinesia symptoms: slow movements and difficulty getting out of a chair ([facility]); did have shuffling gait but she did PT for 8 weeks and is doing much better. Loss of smell:  No. Loss of taste:  No. Urinary Incontinence:  Yes.   Difficulty Swallowing:  No. Handwriting, micrographia: Yes.   Depression:  No. but daughter disagrees Memory changes:  No. but daughter disagrees; per prior notes, daughter  mentioned to PCP since at least 2016 ongoing memory loss.  MMSE in 2016 was 26/30; does own meals/meds.  Does not drive Hallucinations:  No.  visual distortions: No. N/V:  No. Lightheaded:  No.  Syncope: No. Diplopia:  Yes.  , vertical (unsure if monocular or binocular) Dyskinesia:  No.  Neuroimaging has not previously been performed.     ALLERGIES:   Allergies  Allergen Reactions  . Ibuprofen     Rash  . Cepacol [Cetylpyridinium]   . Crestor [Rosuvastatin]     No energy  . Dual Action Complete [Famotidine-Ca Carb-Mag Hydrox]   . Fosamax [Alendronate Sodium]     Nausea     Has taken Reclast  One other time did not have any problems  . Penicillins   . Pravachol [Pravastatin Sodium]   . Valium [Diazepam]     Affected her mind  . Zocor [Simvastatin]     CURRENT MEDICATIONS:  Outpatient Encounter Prescriptions as of 10/10/2015  Medication Sig  . albuterol (PROVENTIL) (2.5 MG/3ML) 0.083% nebulizer solution Take 2.5 mg by nebulization every 6 (six) hours as needed.  Marland Kitchen. aspirin 81 MG tablet Take 81 mg by mouth daily.  Marland Kitchen. atenolol (TENORMIN) 25 MG tablet Take 1 tablet by mouth daily.  Marland Kitchen. gabapentin (NEURONTIN) 100 MG capsule Take 100  mg by mouth 2 (two) times daily.  Marland Kitchen ipratropium (ATROVENT) 0.02 % nebulizer solution Take 500 mcg by nebulization 4 (four) times daily.  Marland Kitchen losartan-hydrochlorothiazide (HYZAAR) 100-25 MG tablet Take 1 tablet by mouth daily.  . naproxen sodium (ANAPROX) 220 MG tablet Take 220 mg by mouth 2 (two) times daily with a meal.  . nystatin (MYCOSTATIN/NYSTOP) 100000 UNIT/GM POWD Apply topically.  . vitamin B-12 (CYANOCOBALAMIN) 1000 MCG tablet Take 1,000 mcg by mouth daily.  . Vitamin D, Ergocalciferol, (DRISDOL) 50000 units CAPS capsule Take 1 capsule by mouth once a week.  . [DISCONTINUED] triamterene-hydrochlorothiazide (MAXZIDE-25) 37.5-25 MG per tablet Take 1 tablet by mouth daily.  . [DISCONTINUED] ezetimibe (ZETIA) 10 MG tablet Take 5 mg by mouth daily.  .  [DISCONTINUED] metoprolol (LOPRESSOR) 50 MG tablet Take 50 mg by mouth 2 (two) times daily.  . [DISCONTINUED] ondansetron (ZOFRAN ODT) 4 MG disintegrating tablet Take 1 tablet (4 mg total) by mouth every 8 (eight) hours as needed for nausea or vomiting.  . [DISCONTINUED] zoledronic acid (RECLAST) 5 MG/100ML SOLN Inject 5 mg into the vein once.   No facility-administered encounter medications on file as of 10/10/2015.    PAST MEDICAL HISTORY:   Past Medical History  Diagnosis Date  . Hypertension   . Chronic kidney disease   . Arthritis   . Osteoporosis   . Fatty liver   . Bell's palsy 1959    R side  . Macular degeneration   . Asthma     PAST SURGICAL HISTORY:   Past Surgical History  Procedure Laterality Date  . Abdominal hysterectomy    . Cholecystectomy    . Appendectomy    . Hernia repair      ventral  . Eye surgery Left   . Rotator cuff repair Left   . Cataract extraction Right     SOCIAL HISTORY:   Social History   Social History  . Marital Status: Widowed    Spouse Name: N/A  . Number of Children: N/A  . Years of Education: N/A   Occupational History  . Not on file.   Social History Main Topics  . Smoking status: Former Smoker    Quit date: 07/05/1976  . Smokeless tobacco: Former Neurosurgeon    Quit date: 08/04/1976  . Alcohol Use: No  . Drug Use: No  . Sexual Activity: No   Other Topics Concern  . Not on file   Social History Narrative    FAMILY HISTORY:   Family Status  Relation Status Death Age  . Mother Deceased     heart disease  . Father Deceased     heart disease  . Sister Deceased     13 siblings 2- cirrohosis  . Brother Alive     healthy  . Daughter Deceased     pulmonary fibrosis  . Daughter Alive     2, healthy  . Son Alive     2, healthy    ROS:  A complete 10 system review of systems was obtained and was unremarkable apart from what is mentioned above.  PHYSICAL EXAMINATION:    VITALS:   Filed Vitals:   10/10/15 0745    BP: 126/80  Pulse: 92  Height: 5' (1.524 m)  Weight: 114 lb (51.71 kg)    GEN:  The patient appears stated age and is in NAD. HEENT:  Normocephalic, atraumatic.  The mucous membranes are moist. The superficial temporal arteries are without ropiness or tenderness.  There is significant  chin tremor CV:  RRR Lungs:  CTAB Neck/HEME:  There are no carotid bruits bilaterally.  Neurological examination:  Orientation: The patient is alert and oriented x3. Fund of knowledge is appropriate.  Recent and remote memory are intact.  Attention and concentration are normal.    Able to name objects and repeat phrases. Cranial nerves: There is good facial symmetry except there is right eye twitching (? From old R bells and aberrant reinnervation). Pupils are equal round and reactive to light bilaterally. Fundoscopic exam is attempted but the disc margins are not well visualized bilaterally. Extraocular muscles are intact. The visual fields are full to confrontational testing. The speech is fluent and clear but she is hypophonic. Soft palate rises symmetrically and there is no tongue deviation. Hearing is intact to conversational tone. Sensation: Sensation is intact to light and pinprick throughout (facial, trunk, extremities). Vibration is intact at the bilateral big toe. There is no extinction with double simultaneous stimulation. There is no sensory dermatomal level identified. Motor: Strength is 5/5 in the bilateral upper and lower extremities.   Shoulder shrug is equal and symmetric.  There is no pronator drift. Deep tendon reflexes: Deep tendon reflexes are 2+-3-/4 at the bilateral biceps, triceps, brachioradialis, patella and achilles. Plantar responses are downgoing bilaterally.  Movement examination: Tone: There is mild increased tone in the LUE.  Tone on the R is normal Abnormal movements: There is R>LUE resting tremor that increases with distraction.  There is chin tremor Coordination:  There is  mild decremation with RAM's, seen with finger taps and toe taps on the L Gait and Station: The patient has  difficulty arising out of a deep-seated chair without the use of the hands and is unable to do so and ultimately requires help getting out of the chair. The patient's stride length is decreased and the right leg sticks to the floor and she turns en bloc.  She does much better when given a walker.     ASSESSMENT/PLAN:  1.  Parkinsonism.  I suspect that this does represent idiopathic Parkinson's disease.  The patient has tremor, bradykinesia, rigidity and postural instability.  -We discussed the diagnosis as well as pathophysiology of the disease.  We discussed treatment options as well as prognostic indicators.  Patient education was provided.  -Greater than 50% of the 60 minute visit was spent in counseling answering questions and talking about what to expect now as well as in the future.  We talked about medication options as well as potential future surgical options.  We talked about safety in the home.  -We decided to add carbidopa/levodopa 25/100.  1/2 tab tid x 1 wk, then 1/2 in am & noon & 1 at night for a week, then 1/2 in am &1 at noon &night for a week, then 1 po tid.  Risks, benefits, side effects and alternative therapies were discussed.  The opportunity to ask questions was given and they were answered to the best of my ability.  The patient expressed understanding and willingness to follow the outlined treatment protocols.  -She just finished PT and thinks that she did great with this and reports that she was shuffling much more before she started this.    -start exercise at the community where she lives safely (has access to chair exercise, biking)  -d/c atenolol as was given for tremor and is already on metoprolol and is c/o fatigue.    -May need to change rollator to traditional walker.  She was given ours  to try in the office and felt like she did better with this 2.  Follow up is  anticipated in the next few months, sooner should new neurologic issues arise.

## 2015-11-11 ENCOUNTER — Ambulatory Visit (INDEPENDENT_AMBULATORY_CARE_PROVIDER_SITE_OTHER): Payer: Medicare Other | Admitting: Internal Medicine

## 2015-11-11 ENCOUNTER — Encounter: Payer: Self-pay | Admitting: Internal Medicine

## 2015-11-11 ENCOUNTER — Ambulatory Visit: Payer: Medicare Other | Admitting: Cardiovascular Disease

## 2015-11-11 VITALS — BP 137/90 | HR 80 | Ht 60.0 in | Wt 114.6 lb

## 2015-11-11 DIAGNOSIS — R079 Chest pain, unspecified: Secondary | ICD-10-CM

## 2015-11-11 DIAGNOSIS — R5383 Other fatigue: Secondary | ICD-10-CM | POA: Diagnosis not present

## 2015-11-11 NOTE — Patient Instructions (Signed)

## 2015-11-11 NOTE — Progress Notes (Signed)
Cardiology Office Note   Date:  11/11/2015   ID:  Patricia NettleCharlotte F Delsanto, DOB [birth year], MRN [id number]  PCP:  Ronnie DossWILLARD,JENNIFER, PA-C  Cardiologist:   Dietrich PatesPaula Ross, MD   Pt referred by Dr Hyman HopesWebb for palpitations and CP      History of Present Illness: Patricia Hopkins is a 82 y.o. female with a history of worsening fatigue and CP  And palpitations  FHx of CAD  Pt referred by Dr Hyman HopesWebb Requests eval    Pt has been exhausted for several years, dating back to a fall she tood several yrs ago   Pt has had some heart fluttering  More when tired    Seen by Dr Tat  Gerlene FeePlaced on Sinemet  SInce then has noticed pressure on chest    Sleeps OK        Outpatient Prescriptions Prior to Visit  Medication Sig Dispense Refill  . albuterol (PROVENTIL) (2.5 MG/3ML) 0.083% nebulizer solution Take 2.5 mg by nebulization every 6 (six) hours as needed.    Marland Kitchen. aspirin 81 MG tablet Take 81 mg by mouth daily.    . carbidopa-levodopa (SINEMET IR) 25-100 MG tablet Take 1 tablet by mouth 3 (three) times daily. 270 tablet 1  . gabapentin (NEURONTIN) 100 MG capsule Take 100 mg by mouth 2 (two) times daily.    Marland Kitchen. ipratropium (ATROVENT) 0.02 % nebulizer solution Take 500 mcg by nebulization 4 (four) times daily.    Marland Kitchen. losartan-hydrochlorothiazide (HYZAAR) 100-25 MG tablet Take 1 tablet by mouth daily.  11  . naproxen sodium (ANAPROX) 220 MG tablet Take 220 mg by mouth 2 (two) times daily with a meal.    . nystatin (MYCOSTATIN/NYSTOP) 100000 UNIT/GM POWD Apply topically.    . vitamin B-12 (CYANOCOBALAMIN) 1000 MCG tablet Take 1,000 mcg by mouth daily.    . Vitamin D, Ergocalciferol, (DRISDOL) 50000 units CAPS capsule Take 1 capsule by mouth once a week.  5  . atenolol (TENORMIN) 25 MG tablet Take 1 tablet by mouth daily. Reported on 11/11/2015  3   No facility-administered medications prior to visit.     Allergies:   Ibuprofen; Cepacol; Crestor; Dual action complete; Fosamax; Penicillins; Pravachol; Valium; and Zocor     Past Medical History  Diagnosis Date  . Hypertension   . Chronic kidney disease   . Arthritis   . Osteoporosis   . Fatty liver   . Bell's palsy 1959    R side  . Macular degeneration   . Asthma     Past Surgical History  Procedure Laterality Date  . Abdominal hysterectomy    . Cholecystectomy    . Appendectomy    . Hernia repair      ventral  . Eye surgery Left   . Rotator cuff repair Left   . Cataract extraction Right      Social History:  The patient  reports that she quit smoking about 39 years ago. She quit smokeless tobacco use about 39 years ago. She reports that she does not drink alcohol or use illicit drugs.   Family History:  The patient's family history is not on file.    ROS:  Please see the history of present illness. All other systems are reviewed and  Negative to the above problem except as noted.    PHYSICAL EXAM: VS:  BP 137/90 mmHg  Pulse 80  Ht 5' (1.524 m)  Wt 114 lb 9.6 oz (51.982 kg)  BMI 22.38 kg/m2  GEN  Frail 83  yo  in no acute distress HEENT: normal Neck: no JVD, carotid bruits, or masses Cardiac: RRR; no murmurs, rubs, or gallops,no edema  Respiratory:  clear to auscultation bilaterally, normal work of breathing GI: soft, nontender, nondistended, + BS  No hepatomegaly   Skin: warm and dry, no rash Neuro:  Deferred Psych: euthymic mood, full affect   EKG:  EKG is ordered today.  SR 79  LAFB  Low voltage  Lipid Panel No results found for: CHOL, TRIG, HDL, CHOLHDL, VLDL, LDLCALC, LDLDIRECT    Wt Readings from Last 3 Encounters:  11/11/15 114 lb 9.6 oz (51.982 kg)  10/10/15 114 lb (51.71 kg)      ASSESSMENT AND PLAN:  1  Fatigue, SOB May represent deconditioning wht age in the setting of Parkinsons  I would recomm an echo to eval LV/RV function and pressues If LVEF is normal I would set up for lexiscan myovue to r/o ischemia    2.  Palpitatons  Follow for now  They do not sound hemodyn destabilizing  Isolated    F/U  based on test results      Signed, Dietrich Pates, MD  11/11/2015 11:13 AM    [facility] [address] [place], [place], [place]  [address] Phone: [phone]; Fax: [phone]

## 2015-11-24 ENCOUNTER — Encounter: Payer: Self-pay | Admitting: Internal Medicine

## 2015-11-26 ENCOUNTER — Other Ambulatory Visit: Payer: Self-pay

## 2015-11-26 ENCOUNTER — Ambulatory Visit (HOSPITAL_COMMUNITY): Payer: Medicare Other | Attending: Cardiovascular Disease

## 2015-11-26 DIAGNOSIS — I129 Hypertensive chronic kidney disease with stage 1 through stage 4 chronic kidney disease, or unspecified chronic kidney disease: Secondary | ICD-10-CM | POA: Diagnosis not present

## 2015-11-26 DIAGNOSIS — R5383 Other fatigue: Secondary | ICD-10-CM

## 2015-11-26 DIAGNOSIS — N189 Chronic kidney disease, unspecified: Secondary | ICD-10-CM | POA: Diagnosis not present

## 2015-11-26 DIAGNOSIS — R079 Chest pain, unspecified: Secondary | ICD-10-CM | POA: Diagnosis not present

## 2015-12-03 ENCOUNTER — Other Ambulatory Visit: Payer: Self-pay | Admitting: *Deleted

## 2015-12-03 DIAGNOSIS — R079 Chest pain, unspecified: Secondary | ICD-10-CM

## 2015-12-03 DIAGNOSIS — R5383 Other fatigue: Secondary | ICD-10-CM

## 2015-12-09 ENCOUNTER — Telehealth (HOSPITAL_COMMUNITY): Payer: Self-pay | Admitting: *Deleted

## 2015-12-09 NOTE — Telephone Encounter (Signed)
Left message on voicemail per DPR in reference to upcoming appointment scheduled on 12/11/15 at 0945 with detailed instructions given per Myocardial Perfusion Study Information Sheet for the test. LM to arrive 15 minutes early, and that it is imperative to arrive on time for appointment to keep from having the test rescheduled. If you need to cancel or reschedule your appointment, please call the office within 24 hours of your appointment. Failure to do so may result in a cancellation of your appointment, and a $50 no show fee. Phone number given for call back for any questions. Nataleigh Griffin, Adelene IdlerCynthia W

## 2015-12-11 ENCOUNTER — Ambulatory Visit (HOSPITAL_COMMUNITY): Payer: Medicare Other | Attending: Cardiovascular Disease

## 2015-12-11 DIAGNOSIS — R079 Chest pain, unspecified: Secondary | ICD-10-CM | POA: Diagnosis not present

## 2015-12-11 DIAGNOSIS — Z8249 Family history of ischemic heart disease and other diseases of the circulatory system: Secondary | ICD-10-CM | POA: Diagnosis not present

## 2015-12-11 DIAGNOSIS — R002 Palpitations: Secondary | ICD-10-CM | POA: Insufficient documentation

## 2015-12-11 DIAGNOSIS — R5383 Other fatigue: Secondary | ICD-10-CM | POA: Insufficient documentation

## 2015-12-11 LAB — MYOCARDIAL PERFUSION IMAGING
CHL CUP NUCLEAR SDS: 5
CHL CUP NUCLEAR SSS: 11
CHL CUP RESTING HR STRESS: 72 {beats}/min
LV sys vol: 5 mL
LVDIAVOL: 28 mL (ref 46–106)
NUC STRESS TID: 1.14
Peak HR: 100 {beats}/min
RATE: 0.28
SRS: 6

## 2015-12-11 MED ORDER — TECHNETIUM TC 99M TETROFOSMIN IV KIT
30.0000 | PACK | Freq: Once | INTRAVENOUS | Status: AC | PRN
  Administered 2015-12-11: 30 via INTRAVENOUS
  Filled 2015-12-11: qty 30

## 2015-12-11 MED ORDER — TECHNETIUM TC 99M TETROFOSMIN IV KIT
10.2000 | PACK | Freq: Once | INTRAVENOUS | Status: AC | PRN
  Administered 2015-12-11: 10 via INTRAVENOUS
  Filled 2015-12-11: qty 10

## 2015-12-11 MED ORDER — REGADENOSON 0.4 MG/5ML IV SOLN
0.4000 mg | Freq: Once | INTRAVENOUS | Status: AC
  Administered 2015-12-11: 0.4 mg via INTRAVENOUS

## 2015-12-24 ENCOUNTER — Ambulatory Visit (INDEPENDENT_AMBULATORY_CARE_PROVIDER_SITE_OTHER): Payer: Medicare Other | Admitting: Neurology

## 2015-12-24 ENCOUNTER — Encounter: Payer: Self-pay | Admitting: Neurology

## 2015-12-24 VITALS — BP 130/70 | HR 90 | Ht 61.0 in | Wt 112.0 lb

## 2015-12-24 DIAGNOSIS — G20 Parkinson's disease: Secondary | ICD-10-CM

## 2015-12-24 NOTE — Progress Notes (Signed)
Patricia Hopkins was seen today in the movement disorders clinic for neurologic consultation at the request of WEBB, CAROL D, MD.  The consultation is for the evaluation of tremor of the face and hands and to rule out Parkinson's disease.  Tremor started in the mouth about 3 years ago and the hands started at the same time.  Pt insists that the R hand started before the L but the daughter states that both hands started at the same time.  They went to the doctor and were told that they were just "tremor."  The patient's sister has Parkinson's disease and she was worried about this.  When she was seen last week by the P.A., the daughter states that her provider was worried about ?cogwheel.  Tremor is noted both at rest and with use.  12/24/15 update:  The patient was seen today in follow-up.  I diagnosed her with Parkinson's disease last visit and started her on levodopa.  Pts daughter states that it did nothing and "she still has the tremors really bad."  Admits that she is ablt to walk much better and farther than she was.  Her atenolol was discontinued since she was placed on that slowly for tremor and was already on a different beta blocker.  Daughter stopped the patients ASA due to bruising.  Has appt with PCP about this next week.   She denies any falls since last visit.  No lightheadedness or near syncope.  I did review records made available to me.  She had a stress Myoview on June 8 and this was normal, with an ejection fraction of 81%.   ALLERGIES:   Allergies  Allergen Reactions  . Ibuprofen     Rash  . Cepacol [Cetylpyridinium]   . Crestor [Rosuvastatin]     No energy  . Dual Action Complete [Famotidine-Ca Carb-Mag Hydrox]   . Fosamax [Alendronate Sodium]     Nausea     Has taken Reclast  One other time did not have any problems  . Penicillins   . Pravachol [Pravastatin Sodium]   . Valium [Diazepam]     Affected her mind  . Zocor [Simvastatin]     CURRENT MEDICATIONS:    Outpatient Encounter Prescriptions as of 12/24/2015  Medication Sig  . albuterol (PROVENTIL) (2.5 MG/3ML) 0.083% nebulizer solution Take 2.5 mg by nebulization every 6 (six) hours as needed.  . carbidopa-levodopa (SINEMET IR) 25-100 MG tablet Take 1 tablet by mouth 3 (three) times daily.  Marland Kitchen gabapentin (NEURONTIN) 100 MG capsule Take 100 mg by mouth 2 (two) times daily.  Marland Kitchen ipratropium (ATROVENT) 0.02 % nebulizer solution Take 500 mcg by nebulization 4 (four) times daily.  Marland Kitchen losartan-hydrochlorothiazide (HYZAAR) 100-25 MG tablet Take 1 tablet by mouth daily.  . naproxen sodium (ANAPROX) 220 MG tablet Take 220 mg by mouth 2 (two) times daily with a meal.  . nystatin (MYCOSTATIN/NYSTOP) 100000 UNIT/GM POWD Apply topically.  . vitamin B-12 (CYANOCOBALAMIN) 1000 MCG tablet Take 1,000 mcg by mouth daily.  . Vitamin D, Ergocalciferol, (DRISDOL) 50000 units CAPS capsule Take 1 capsule by mouth once a week.  . [DISCONTINUED] aspirin 81 MG tablet Take 81 mg by mouth daily.   No facility-administered encounter medications on file as of 12/24/2015.    PAST MEDICAL HISTORY:   Past Medical History  Diagnosis Date  . Hypertension   . Chronic kidney disease   . Arthritis   . Osteoporosis   . Fatty liver   . Bell's palsy  1959    R side  . Macular degeneration   . Asthma     PAST SURGICAL HISTORY:   Past Surgical History  Procedure Laterality Date  . Abdominal hysterectomy    . Cholecystectomy    . Appendectomy    . Hernia repair      ventral  . Eye surgery Left   . Rotator cuff repair Left   . Cataract extraction Right     SOCIAL HISTORY:   Social History   Social History  . Marital Status: Widowed    Spouse Name: N/A  . Number of Children: N/A  . Years of Education: N/A   Occupational History  . Not on file.   Social History Main Topics  . Smoking status: Former Smoker    Quit date: 07/05/1976  . Smokeless tobacco: Former Neurosurgeon    Quit date: 08/04/1976  . Alcohol Use: No  .  Drug Use: No  . Sexual Activity: No   Other Topics Concern  . Not on file   Social History Narrative    FAMILY HISTORY:   Family Status  Relation Status Death Age  . Mother Deceased     heart disease  . Father Deceased     heart disease  . Sister Deceased     13 siblings 2- cirrohosis  . Brother Alive     healthy  . Daughter Deceased     pulmonary fibrosis  . Daughter Alive     2, healthy  . Son Alive     2, healthy    ROS:  A complete 10 system review of systems was obtained and was unremarkable apart from what is mentioned above.  PHYSICAL EXAMINATION:    VITALS:   Filed Vitals:   12/24/15 1002  BP: 130/70  Pulse: 90  Height: 5\' 1"  (1.549 m)  Weight: 112 lb (50.803 kg)    GEN:  The patient appears stated age and is in NAD. HEENT:  Normocephalic, atraumatic.  The mucous membranes are moist. The superficial temporal arteries are without ropiness or tenderness.  There is significant chin tremor CV:  RRR Lungs:  CTAB Neck/HEME:  There are no carotid bruits bilaterally.  Neurological examination:  Orientation:  Montreal Cognitive Assessment  12/24/2015  Visuospatial/ Executive (0/5) 1  Naming (0/3) 2  Attention: Read list of digits (0/2) 1  Attention: Read list of letters (0/1) 1  Attention: Serial 7 subtraction starting at 100 (0/3) 3  Language: Repeat phrase (0/2) 2  Language : Fluency (0/1) 1  Abstraction (0/2) 2  Delayed Recall (0/5) 4  Orientation (0/6) 5  Total 22  Adjusted Score (based on education) 23    Cranial nerves: There is good facial symmetry except there is right eye twitching (? From old R bells and aberrant reinnervation).  The visual fields are full to confrontational testing. The speech is fluent and clear but she is hypophonic. Soft palate rises symmetrically and there is no tongue deviation. Hearing is intact to conversational tone. Sensation: Sensation is intact to light and pinprick throughout (facial, trunk, extremities).  Vibration is intact at the bilateral big toe. There is no extinction with double simultaneous stimulation. There is no sensory dermatomal level identified. Motor: Strength is 5/5 in the bilateral upper and lower extremities.   Shoulder shrug is equal and symmetric.  There is no pronator drift.  Movement examination: Tone: There is mild increased tone in the LUE.  Tone on the R is normal Abnormal movements: There is R>LUE  resting tremor that increases with distraction.  There is chin tremor Coordination:  There is mild decremation with RAM's, seen with finger taps and toe taps on the L Gait and Station: The patient has  difficulty arising out of a deep-seated chair without the use of the hands and is unable to do so and ultimately requires help getting out of the chair. The patient's stride length is good with her walker.    ASSESSMENT/PLAN:  1.  Idiopathic Parkinson's disease.  The patient has tremor, bradykinesia, rigidity and postural instability.  -Difficult to tell if med working as she didn't take her levodopa today.  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  Would continue this and she was agreeable.  Talked about fact that DA agonist may help tremor better but don't recommend starting it in this age group and she was agreeable.    -encouraged exercise 2.  Follow up is anticipated in the next few months, sooner should new neurologic issues arise.  Much greater than 50% of this visit was spent in counseling and coordinating care.  Total face to face time:  25 min

## 2016-01-18 ENCOUNTER — Encounter (HOSPITAL_BASED_OUTPATIENT_CLINIC_OR_DEPARTMENT_OTHER): Payer: Self-pay | Admitting: *Deleted

## 2016-01-18 ENCOUNTER — Emergency Department (HOSPITAL_BASED_OUTPATIENT_CLINIC_OR_DEPARTMENT_OTHER)
Admission: EM | Admit: 2016-01-18 | Discharge: 2016-01-18 | Disposition: A | Discharge status: Home / Self Care | Payer: Medicare Other | Attending: Emergency Medicine | Admitting: Emergency Medicine

## 2016-01-18 ENCOUNTER — Emergency Department (HOSPITAL_BASED_OUTPATIENT_CLINIC_OR_DEPARTMENT_OTHER): Payer: Medicare Other

## 2016-01-18 DIAGNOSIS — M1990 Unspecified osteoarthritis, unspecified site: Secondary | ICD-10-CM | POA: Diagnosis not present

## 2016-01-18 DIAGNOSIS — S42292A Other displaced fracture of upper end of left humerus, initial encounter for closed fracture: Secondary | ICD-10-CM | POA: Insufficient documentation

## 2016-01-18 DIAGNOSIS — S4992XA Unspecified injury of left shoulder and upper arm, initial encounter: Secondary | ICD-10-CM | POA: Diagnosis present

## 2016-01-18 DIAGNOSIS — N189 Chronic kidney disease, unspecified: Secondary | ICD-10-CM | POA: Diagnosis not present

## 2016-01-18 DIAGNOSIS — J45909 Unspecified asthma, uncomplicated: Secondary | ICD-10-CM | POA: Diagnosis not present

## 2016-01-18 DIAGNOSIS — Z79899 Other long term (current) drug therapy: Secondary | ICD-10-CM | POA: Insufficient documentation

## 2016-01-18 DIAGNOSIS — Y939 Activity, unspecified: Secondary | ICD-10-CM | POA: Diagnosis not present

## 2016-01-18 DIAGNOSIS — Y929 Unspecified place or not applicable: Secondary | ICD-10-CM | POA: Insufficient documentation

## 2016-01-18 DIAGNOSIS — M810 Age-related osteoporosis without current pathological fracture: Secondary | ICD-10-CM | POA: Insufficient documentation

## 2016-01-18 DIAGNOSIS — G20 Parkinson's disease: Secondary | ICD-10-CM | POA: Diagnosis not present

## 2016-01-18 DIAGNOSIS — Z87891 Personal history of nicotine dependence: Secondary | ICD-10-CM | POA: Diagnosis not present

## 2016-01-18 DIAGNOSIS — Y999 Unspecified external cause status: Secondary | ICD-10-CM | POA: Diagnosis not present

## 2016-01-18 DIAGNOSIS — I129 Hypertensive chronic kidney disease with stage 1 through stage 4 chronic kidney disease, or unspecified chronic kidney disease: Secondary | ICD-10-CM | POA: Diagnosis not present

## 2016-01-18 DIAGNOSIS — W1839XA Other fall on same level, initial encounter: Secondary | ICD-10-CM | POA: Insufficient documentation

## 2016-01-18 HISTORY — DX: Parkinson's disease: G20

## 2016-01-18 MED ORDER — MORPHINE SULFATE (PF) 4 MG/ML IV SOLN
3.0000 mg | Freq: Once | INTRAVENOUS | Status: AC
  Administered 2016-01-18: 3 mg via INTRAMUSCULAR
  Filled 2016-01-18: qty 1

## 2016-01-18 MED ORDER — HYDROCODONE-ACETAMINOPHEN 5-325 MG PO TABS: 1.0000 | ORAL_TABLET | ORAL | Status: DC | PRN

## 2016-01-18 NOTE — ED Provider Notes (Signed)
CSN: [id number]     Arrival date & time 01/18/16  1515 History  By signing my name below, I, Levon Hedger, attest that this documentation has been prepared under the direction and in the presence of Raeford Razor, MD . Electronically Signed: Levon Hedger, Scribe. 01/18/2016. 4:49 PM.   Chief Complaint  Patient presents with  . Shoulder Pain    The history is provided by the patient and a relative. No language interpreter was used.    HPI Comments:  Patricia Hopkins is a 82 y.o. female with PMHx of Parkinson Disease who presents to the Emergency Department complaining of sudden onset left shoulder pain with radiation to neck s/p fall at today at 11 am. Pt states she was "rushing to the bathroom" and was not using her walker when she fell. She states she took a Naprosyn with no relief.  Per daughter, pt is unable to tolerate most pain medication as it makes her "loopy". Pt is not currently on any blood thinners. She denies headache, chest pain, numbness, tingling, or nausea. No other complaints at this time.  Past Medical History  Diagnosis Date  . Hypertension   . Chronic kidney disease   . Arthritis   . Osteoporosis   . Fatty liver   . Bell's palsy 1959    R side  . Macular degeneration   . Asthma   . Parkinson disease [facility])    Past Surgical History  Procedure Laterality Date  . Abdominal hysterectomy    . Cholecystectomy    . Appendectomy    . Hernia repair      ventral  . Eye surgery Left   . Rotator cuff repair Left   . Cataract extraction Right    History reviewed. No pertinent family history. Social History  Substance Use Topics  . Smoking status: Former Smoker    Quit date: 07/05/1976  . Smokeless tobacco: Former Neurosurgeon    Quit date: 08/04/1976  . Alcohol Use: No   OB History    No data available     Review of Systems  Cardiovascular: Negative for chest pain.  Gastrointestinal: Negative for nausea.  Musculoskeletal: Positive for myalgias, arthralgias  and neck pain.  Neurological: Negative for numbness and headaches.  All other systems reviewed and are negative.   Allergies  Ibuprofen; Cepacol; Crestor; Dual action complete; Fosamax; Penicillins; Pravachol; Valium; and Zocor  Home Medications   Prior to Admission medications   Medication Sig Start Date End Date Taking? Authorizing Provider  sertraline (ZOLOFT) 25 MG tablet Take 25 mg by mouth daily.   Yes Historical Provider, MD  albuterol (PROVENTIL) (2.5 MG/3ML) 0.083% nebulizer solution Take 2.5 mg by nebulization every 6 (six) hours as needed.    Historical Provider, MD  carbidopa-levodopa (SINEMET IR) 25-100 MG tablet Take 1 tablet by mouth 3 (three) times daily. 10/10/15   Octaviano Batty Tat, DO  gabapentin (NEURONTIN) 100 MG capsule Take 100 mg by mouth 2 (two) times daily.    Historical Provider, MD  ipratropium (ATROVENT) 0.02 % nebulizer solution Take 500 mcg by nebulization 4 (four) times daily.    Historical Provider, MD  losartan-hydrochlorothiazide (HYZAAR) 100-25 MG tablet Take 1 tablet by mouth daily. 09/30/15   Historical Provider, MD  naproxen sodium (ANAPROX) 220 MG tablet Take 220 mg by mouth 2 (two) times daily with a meal.    Historical Provider, MD  nystatin (MYCOSTATIN/NYSTOP) 100000 UNIT/GM POWD Apply topically.    Historical Provider, MD  vitamin B-12 (CYANOCOBALAMIN) 1000  MCG tablet Take 1,000 mcg by mouth daily.    Historical Provider, MD  Vitamin D, Ergocalciferol, (DRISDOL) 50000 units CAPS capsule Take 1 capsule by mouth once a week. 09/30/15   Historical Provider, MD   BP 136/79 mmHg  Pulse 83  Temp(Src) 98 F (36.7 C) (Oral)  Resp 18  Ht 5\' 1"  (1.549 m)  Wt 110 lb (49.896 kg)  BMI 20.80 kg/m2  SpO2 96% Physical Exam  Constitutional: She is oriented to person, place, and time. She appears well-developed and well-nourished. No distress.  HENT:  Head: Normocephalic and atraumatic.  Eyes: Conjunctivae are normal.  Cardiovascular: Normal rate.    Pulmonary/Chest: Effort normal.  Abdominal: She exhibits no distension.  Musculoskeletal: She exhibits tenderness.  Tenderness and deformity to proximal humerus Closed injury NVI  Neurological: She is alert and oriented to person, place, and time.  Skin: Skin is warm and dry.  Surgical scar left shoulder  Psychiatric: She has a normal mood and affect.  Nursing note and vitals reviewed.   ED Course  Procedures  DIAGNOSTIC STUDIES:  Oxygen Saturation is 98% on RA, normal by my interpretation.    COORDINATION OF CARE:  4:00 PM Will order morphine 4 Mg. Discussed treatment plan with pt at bedside and pt agreed to plan.  Labs Review Labs Reviewed - No data to display  Imaging Review Dg Shoulder Left  01/18/2016  CLINICAL DATA:  Fall today with injury to left shoulder. EXAM: LEFT SHOULDER - 2+ VIEW COMPARISON:  None. FINDINGS: Three-view exam left shoulder shows a comminuted humeral head fracture with a probable degree of impaction. The acromioclavicular and coracoclavicular distances are preserved. Bones are diffusely demineralized. IMPRESSION: Comminuted fracture humeral head. Electronically Signed   By: Kennith CenterEric  Mansell M.D.   On: 01/18/2016 16:19   I have personally reviewed and evaluated these images and lab results as part of my medical decision-making.   EKG Interpretation None      MDM   Final diagnoses:  Humeral head fracture, left, closed, initial encounter    83yF with L shoulder pain after mechanical fall. Imaging significant for L humeral head fx. Closed injury. NVI. Sling. PRN pain meds. Ortho FU. It has been determined that no acute conditions requiring further emergency intervention are present at this time. The patient has been advised of the diagnosis and plan. I reviewed any labs and imaging including any potential incidental findings. We have discussed signs and symptoms that warrant return to the ED and they are listed in the discharge instructions.   I  personally preformed the services scribed in my presence. The recorded information has been reviewed is accurate. Raeford RazorStephen Marcin Holte, MD.     Raeford RazorStephen England Greb, MD 01/18/16 [phone]

## 2016-01-18 NOTE — Discharge Instructions (Signed)
°Shoulder Fracture (Proximal Humerus or Glenoid) °A shoulder fracture is a broken upper arm bone or a broken socket bone. The humerus is the upper arm bone and the glenoid is the shoulder socket. Proximal means the humerus is broken near the shoulder. Most of the time the bones of a broken shoulder are in an acceptable position. Usually, the injury can be treated with a shoulder immobilizer or sling and swath bandage. These devices support the arm and prevent any shoulder movement. If the bones are not in a good position, then surgery is sometimes needed. Shoulder fractures usually initially cause swelling, pain, and discoloration around the upper arm. They heal in 8 to 12 weeks with proper treatment. °SYMPTOMS  °At the time of injury: °· Pain. °· Tenderness. °· Regular body contours are not normal. °Later symptoms may include: °· Swelling and bruising of the elbow and hand. °· Swelling and bruising of the arm or chest. °Other symptoms include: °· Pain when lifting or turning the arm. °· Paralysis below the fracture. °· Numbness or coldness below the fracture. °CAUSES  °· Indirect force from falling on an outstretched arm. °· A blow to the shoulder. °RISK INCREASES WITH: °· Not being in shape. °· Playing contact sports, such as football, soccer, hockey, or rugby. °· Sports where falling on an outstretched arm occurs, such as basketball, skateboarding, or volleyball. °· History of bone or joint disease. °· History of shoulder injury. °PREVENTION °· Warm up before activity. °· Stretch before activity. °· Stay in shape with your: °¨ Heart fitness. °¨ Flexibility. °¨ Shoulder Strength. °· Falling with the proper technique. °PROGNOSIS  °In adults, healing time is about 7 weeks. For children, healing time is about 5 weeks. Surgery may be needed. °RELATED COMPLICATIONS °· The bones do not heal together (nonunion). °· The bones do not align properly when they heal (malunion). °· Long-term problems with pain, stiffness,  swelling, or loss of motion. °· The injured arm heals shorter than the other. °· Nerves are injured in the arm. °· Arthritis in the shoulder. °· Normal bone growth is interrupted in children. °· Blood supply to the shoulder joint is diminished. °TREATMENT °If the bones are aligned, then initial treatment will be with ice and medicine to help with pain. The shoulder will be held in place with a sling (immobilization). The shoulder will be allowed to heal for up to 6 weeks. Injuries that may need surgery include: °· Severe fractures. °· Fractures that are not in appropriate alignment (displaced). °· Non-displaced fractures (not common). °Surgery helps the bones align correctly. The bones may be held in place with: °· Sutures. °· Wires. °· Rods. °· Plates. °· Screws. °· Pins. °If you have had surgery or not, you will likely be assisted by a physical therapist or athletic trainer to get the best results with your injured shoulder. This will likely include exercises to strengthen and stretch the injured and surrounding areas. °MEDICATION °· If pain medicine is needed, nonsteroidal anti-inflammatory medicines (such as aspirin or ibuprofen) or other minor pain relievers (such as acetaminophen) are often advised. °· Do not take pain medicine for 7 days before surgery. °· Stronger pain relievers may be prescribed. Use only as directed and take only as much as you need. °COLD THERAPY °Cold treatment (icing) relieves pain and reduces inflammation. Cold treatment should be applied for 10 to 15 minutes every 2 to 3 hours, and immediately after activity that aggravates your symptoms. Use ice packs or an ice massage. °SEEK IMMEDIATE   MEDICAL CARE IF: °· You have severe shoulder pain unrelieved by rest and taking pain medicine. °· You have pain, numbness, tingling, or weakness in the hand or wrist. °· You have shortness of breath, chest pain, severe weakness, or fainting. °· You have severe pain with motion of the fingers or  wrist. °· Blue, gray, or dark color appears in the fingernails on injured extremity. °  °This information is not intended to replace advice given to you by your health care provider. Make sure you discuss any questions you have with your health care provider. °  °Document Released: 06/21/2005 Document Revised: 09/13/2011 Document Reviewed: 10/03/2008 °Elsevier Interactive Patient Education ©2016 [employer] ° °

## 2016-01-18 NOTE — ED Notes (Signed)
Left shoulder pain since falling today.  Cms intact.

## 2016-04-04 ENCOUNTER — Other Ambulatory Visit: Payer: Self-pay | Admitting: Neurology

## 2016-04-26 ENCOUNTER — Ambulatory Visit: Payer: Medicare Other | Admitting: Neurology

## 2016-05-04 NOTE — Progress Notes (Signed)
Patricia Hopkins was seen today in the movement disorders clinic for neurologic consultation at the request of WEBB, CAROL D, MD.  The consultation is for the evaluation of tremor of the face and hands and to rule out Parkinson's disease.  Tremor started in the mouth about 3 years ago and the hands started at the same time.  Pt insists that the R hand started before the L but the daughter states that both hands started at the same time.  They went to the doctor and were told that they were just "tremor."  The patient's sister has Parkinson's disease and she was worried about this.  When she was seen last week by the P.A., the daughter states that her provider was worried about ?cogwheel.  Tremor is noted both at rest and with use.  12/24/15 update:  The patient was seen today in follow-up.  I diagnosed her with Parkinson's disease last visit and started her on levodopa.  Pts daughter states that it did nothing and "she still has the tremors really bad."  Admits that she is ablt to walk much better and farther than she was.  Her atenolol was discontinued since she was placed on that slowly for tremor and was already on a different beta blocker.  Daughter stopped the patients ASA due to bruising.  Has appt with PCP about this next week.   She denies any falls since last visit.  No lightheadedness or near syncope.  I did review records made available to me.  She had a stress Myoview on June 8 and this was normal, with an ejection fraction of 81%.  05/05/16 update:  The patient is a history of Parkinson's disease.  Daughter accompanies her and supplements the history.  She is on carbidopa/levodopa 25/100, one tablet 3 times per day.  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  She had 2-3 falls since last visit.  Daughter states that had fx of arm but told no intervention other than sling.  She wasn't using the walker when she fell.  She states that the phone rang and she didn't grab  the walker and fell.  Cannot tell me about other falls.  Was supposed to do balance PT but didn't like the person who came out and didn't call back.  That was 2 years ago.   No lightheadedness or near syncope.   ALLERGIES:   Allergies  Allergen Reactions  . Ibuprofen     Rash  . Cepacol [Cetylpyridinium]   . Crestor [Rosuvastatin]     No energy  . Dual Action Complete [Famotidine-Ca Carb-Mag Hydrox]   . Fosamax [Alendronate Sodium]     Nausea     Has taken Reclast  One other time did not have any problems  . Penicillins   . Pravachol [Pravastatin Sodium]   . Valium [Diazepam]     Affected her mind  . Zocor [Simvastatin]     CURRENT MEDICATIONS:  Outpatient Encounter Prescriptions as of 05/05/2016  Medication Sig  . albuterol (PROVENTIL) (2.5 MG/3ML) 0.083% nebulizer solution Take 2.5 mg by nebulization every 6 (six) hours as needed.  . carbidopa-levodopa (SINEMET IR) 25-100 MG tablet TAKE 1 TABLET BY MOUTH THREE TIMES DAILY  . gabapentin (NEURONTIN) 100 MG capsule Take 100 mg by mouth at bedtime.   Marland Kitchen. ipratropium (ATROVENT) 0.02 % nebulizer solution Take 500 mcg by nebulization 4 (four) times daily.  Marland Kitchen. losartan-hydrochlorothiazide (HYZAAR) 100-25 MG tablet Take 1 tablet by mouth daily.  .Marland Kitchen  naproxen sodium (ANAPROX) 220 MG tablet Take 220 mg by mouth 2 (two) times daily with a meal.  . vitamin B-12 (CYANOCOBALAMIN) 1000 MCG tablet Take 1,000 mcg by mouth daily.  . Vitamin D, Ergocalciferol, (DRISDOL) 50000 units CAPS capsule Take 1 capsule by mouth once a week.  . [DISCONTINUED] sertraline (ZOLOFT) 25 MG tablet Take 25 mg by mouth daily.  . sertraline (ZOLOFT) 50 MG tablet   . [DISCONTINUED] HYDROcodone-acetaminophen (NORCO/VICODIN) 5-325 MG tablet Take 1 tablet by mouth every 4 (four) hours as needed.  . [DISCONTINUED] nystatin (MYCOSTATIN/NYSTOP) 100000 UNIT/GM POWD Apply topically.   No facility-administered encounter medications on file as of 05/05/2016.     PAST MEDICAL  HISTORY:   Past Medical History:  Diagnosis Date  . Arthritis   . Asthma   . Bell's palsy 1959   R side  . Chronic kidney disease   . Fatty liver   . Hypertension   . Macular degeneration   . Osteoporosis   . Parkinson disease (HCC)     PAST SURGICAL HISTORY:   Past Surgical History:  Procedure Laterality Date  . ABDOMINAL HYSTERECTOMY    . APPENDECTOMY    . CATARACT EXTRACTION Right   . CHOLECYSTECTOMY    . EYE SURGERY Left   . HERNIA REPAIR     ventral  . ROTATOR CUFF REPAIR Left     SOCIAL HISTORY:   Social History   Social History  . Marital status: Widowed    Spouse name: N/A  . Number of children: N/A  . Years of education: N/A   Occupational History  . Not on file.   Social History Main Topics  . Smoking status: Former Smoker    Quit date: 07/05/1976  . Smokeless tobacco: Former NeurosurgeonUser    Quit date: 08/04/1976  . Alcohol use No  . Drug use: No  . Sexual activity: No   Other Topics Concern  . Not on file   Social History Narrative  . No narrative on file    FAMILY HISTORY:   Family Status  Relation Status  . Mother Deceased   heart disease  . Father Deceased   heart disease  . Sister Deceased   13 siblings 2- cirrohosis  . Brother Alive   healthy  . Daughter Deceased   pulmonary fibrosis  . Daughter Alive   2, healthy  . Son Alive   2, healthy    ROS:  A complete 10 system review of systems was obtained and was unremarkable apart from what is mentioned above.  PHYSICAL EXAMINATION:    VITALS:   Vitals:   05/05/16 1103  BP: 132/84  Pulse: 95  SpO2: 97%    GEN:  The patient appears stated age and is in NAD. HEENT:  Normocephalic, atraumatic.  The mucous membranes are moist. The superficial temporal arteries are without ropiness or tenderness.  There is significant chin tremor CV:  RRR Lungs:  CTAB Neck/HEME:  There are no carotid bruits bilaterally.  Neurological examination:  Orientation:  Montreal Cognitive Assessment   12/24/2015  Visuospatial/ Executive (0/5) 1  Naming (0/3) 2  Attention: Read list of digits (0/2) 1  Attention: Read list of letters (0/1) 1  Attention: Serial 7 subtraction starting at 100 (0/3) 3  Language: Repeat phrase (0/2) 2  Language : Fluency (0/1) 1  Abstraction (0/2) 2  Delayed Recall (0/5) 4  Orientation (0/6) 5  Total 22  Adjusted Score (based on education) 23    Cranial nerves:  There is good facial symmetry except there is right eye twitching (? From old R bells and aberrant reinnervation).  The visual fields are full to confrontational testing. The speech is fluent and clear but she is hypophonic. Soft palate rises symmetrically and there is no tongue deviation. Hearing is intact to conversational tone. Sensation: Sensation is intact to light Touch throughout. Motor: Strength is 5/5 in the bilateral upper and lower extremities.   Shoulder shrug is equal and symmetric.  There is no pronator drift.  Movement examination: Tone: There is mild-mod increased tone in the LUE (patient did not take her medication today).  Tone on the R is normal Abnormal movements: There is R>LUE resting tremor that increases with distraction.  There is chin tremor Coordination:  There is mild decremation with RAM's, seen with finger taps and toe taps on the L Gait and Station: The patient has  difficulty arising out of a deep-seated chair without the use of the hands and is unable to do so and ultimately requires help getting out of the chair. The patient's stride length is good when given a walker but she is still slow and tenuous.    ASSESSMENT/PLAN:  1.  Idiopathic Parkinson's disease.  The patient has tremor, bradykinesia, rigidity and postural instability.  -Difficult to tell if med working as she didn't take her levodopa today for her last visit when I had seen her (begs the question if she is taking it).  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  Would continue this and she was agreeable.  Talked about fact that DA agonist may help tremor better but don't recommend starting it in this age group and she was agreeable.  Talked about amantadine but could cause confusion and they just want to stay with this  -RX home PT  -RX walker with wheels/tennis balls as hers is getting out in front of her and causing too much momentum. 2.  Follow up is anticipated in the next few months, sooner should new neurologic issues arise.  Much greater than 50% of this visit was spent in counseling and coordinating care.  Total face to face time:  25 min

## 2016-05-05 ENCOUNTER — Encounter: Payer: Self-pay | Admitting: Neurology

## 2016-05-05 ENCOUNTER — Ambulatory Visit (INDEPENDENT_AMBULATORY_CARE_PROVIDER_SITE_OTHER): Payer: Medicare Other | Admitting: Neurology

## 2016-05-05 VITALS — BP 132/84 | HR 95

## 2016-05-05 DIAGNOSIS — G20 Parkinson's disease: Secondary | ICD-10-CM | POA: Diagnosis not present

## 2016-05-05 NOTE — Patient Instructions (Signed)
1. [facility]. They will contact you to set this up.

## 2016-05-07 ENCOUNTER — Telehealth: Payer: Self-pay | Admitting: Neurology

## 2016-05-07 NOTE — Telephone Encounter (Signed)
Amy, PT with [facility], called for order to see patient 2x week for 4 weeks to work on gait and balance. Verbal order given.

## 2016-05-12 ENCOUNTER — Telehealth: Payer: Self-pay | Admitting: Neurology

## 2016-05-12 NOTE — Telephone Encounter (Signed)
OT therapy evaluation moved from this week to next per family request. Verbal [facility].

## 2016-05-17 ENCOUNTER — Telehealth: Payer: Self-pay | Admitting: Neurology

## 2016-05-17 NOTE — Telephone Encounter (Signed)
-----   [facility] Cantey sent at 05/17/2016 10:27 AM EST ----- [facility]/Dawn CB# [phone]

## 2016-05-17 NOTE — Telephone Encounter (Signed)
Spoke with Marcelino DusterMichelle and gave verbal order for OT eval.

## 2016-05-21 ENCOUNTER — Telehealth: Payer: Self-pay | Admitting: Neurology

## 2016-05-21 NOTE — Telephone Encounter (Signed)
Laveda NormanGave Michelle, OT [facility], verbal order to come out once a week next week for holiday and then resume with two times weekly visits.

## 2016-06-03 ENCOUNTER — Encounter: Payer: Self-pay | Admitting: Neurology

## 2016-07-04 ENCOUNTER — Other Ambulatory Visit: Payer: Self-pay | Admitting: Neurology

## 2016-07-12 IMAGING — NM NM MISC PROCEDURE
3 series · 18 of 18 positions shown · non-contrast
Comparison: none

[Series 1: wbr_s-proj_st stress_(id)_sa · 6.5mm · 6.51mm/px · 6 of 512 frames shown (1 of 2)]
[frame 43/512]
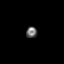
[frame 128/512]
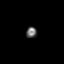
[frame 214/512]
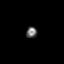
[frame 299/512]
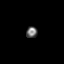
[frame 384/512]
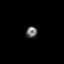
[frame 470/512]
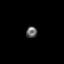

[Series 1: wbr_s-proj_st stress_(id)_sa · 6.5mm · 6.51mm/px · 6 of 64 frames shown (2 of 2)]
[frame 6/64]
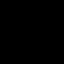
[frame 16/64]
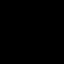
[frame 27/64]
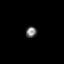
[frame 38/64]
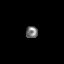
[frame 48/64]
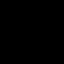
[frame 59/64]
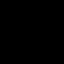

[Series 1: wbr_r-proj_st rest_(id)_sa · 6.5mm · 6.51mm/px · 6 of 64 frames shown]
[frame 6/64]
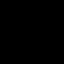
[frame 16/64]
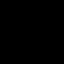
[frame 27/64]
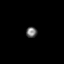
[frame 38/64]
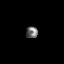
[frame 48/64]
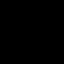
[frame 59/64]
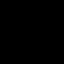

[18 of 18 positions shown; findings below may reference images not displayed]

Canned report from images found in remote index.

Refer to host system for actual result text.

## 2016-07-16 ENCOUNTER — Emergency Department (HOSPITAL_COMMUNITY): Payer: Medicare Other

## 2016-07-16 ENCOUNTER — Emergency Department (HOSPITAL_COMMUNITY)
Admission: EM | Admit: 2016-07-16 | Discharge: 2016-07-16 | Disposition: A | Discharge status: Home / Self Care | Payer: Medicare Other | Attending: Emergency Medicine | Admitting: Emergency Medicine

## 2016-07-16 ENCOUNTER — Encounter (HOSPITAL_COMMUNITY): Payer: Self-pay | Admitting: Emergency Medicine

## 2016-07-16 DIAGNOSIS — W19XXXA Unspecified fall, initial encounter: Secondary | ICD-10-CM

## 2016-07-16 DIAGNOSIS — Z79899 Other long term (current) drug therapy: Secondary | ICD-10-CM | POA: Diagnosis not present

## 2016-07-16 DIAGNOSIS — Z87891 Personal history of nicotine dependence: Secondary | ICD-10-CM | POA: Diagnosis not present

## 2016-07-16 DIAGNOSIS — W06XXXA Fall from bed, initial encounter: Secondary | ICD-10-CM | POA: Insufficient documentation

## 2016-07-16 DIAGNOSIS — Y929 Unspecified place or not applicable: Secondary | ICD-10-CM | POA: Diagnosis not present

## 2016-07-16 DIAGNOSIS — G20 Parkinson's disease: Secondary | ICD-10-CM | POA: Diagnosis not present

## 2016-07-16 DIAGNOSIS — Y939 Activity, unspecified: Secondary | ICD-10-CM | POA: Insufficient documentation

## 2016-07-16 DIAGNOSIS — I129 Hypertensive chronic kidney disease with stage 1 through stage 4 chronic kidney disease, or unspecified chronic kidney disease: Secondary | ICD-10-CM | POA: Diagnosis not present

## 2016-07-16 DIAGNOSIS — Y999 Unspecified external cause status: Secondary | ICD-10-CM | POA: Diagnosis not present

## 2016-07-16 DIAGNOSIS — N189 Chronic kidney disease, unspecified: Secondary | ICD-10-CM | POA: Diagnosis not present

## 2016-07-16 DIAGNOSIS — M25551 Pain in right hip: Secondary | ICD-10-CM

## 2016-07-16 DIAGNOSIS — J45909 Unspecified asthma, uncomplicated: Secondary | ICD-10-CM | POA: Insufficient documentation

## 2016-07-16 MED ORDER — MORPHINE SULFATE (PF) 4 MG/ML IV SOLN
4.0000 mg | Freq: Once | INTRAVENOUS | Status: AC
  Administered 2016-07-16: 4 mg via INTRAMUSCULAR
  Filled 2016-07-16: qty 1

## 2016-07-16 MED ORDER — HYDROCODONE-ACETAMINOPHEN 5-325 MG PO TABS: 1.0000 | ORAL_TABLET | Freq: Four times a day (QID) | ORAL | 0 refills | Status: DC | PRN

## 2016-07-16 NOTE — ED Triage Notes (Signed)
Per EMS-fell 2 nights ago getting out of her high bed-patient remembers fall-right hip contusion-no deformities

## 2016-07-16 NOTE — ED Notes (Signed)
Bed: [facility] Expected date:  Expected time:  Means of arrival:  Comments: EMS fall 

## 2016-07-16 NOTE — Discharge Instructions (Signed)
As discussed, it is normal to feel worse in the days immediately following a fall regardless of medication use. ° °However, please take all medication as directed, use ice packs liberally.  If you develop any new, or concerning changes in your condition, please return here for further evaluation and management.   ° °Otherwise, please return followup with your physician ° ° ° °

## 2016-07-16 NOTE — ED Provider Notes (Signed)
[facility]-EMERGENCY DEPT Provider Note   CSN: [id number] Arrival date & time: 07/16/16  1215     History   Chief Complaint Chief Complaint  Patient presents with  . Fall    HPI Patricia Hopkins is a 83 y.o. female.  HPI  Patient presents one after a fall with ongoing pain in her right hip. Patient states that she walks with a walker, and has been in her usual state of health. Yesterday, she had a mechanical fall. She recalls the entirety of the. She denies head trauma, neck pain, confusion, disorientation, or complaints beyond new pain in her right hip. Pain is sharp, severe, not improved with OTC medication. She has been able to bear weight, minimally since the event, but essentially cannot perform her previously possible activities of daily living. She is here with her daughter who assists with the history of present illness.   Past Medical History:  Diagnosis Date  . Arthritis   . Asthma   . Bell's palsy 1959   R side  . Chronic kidney disease   . Fatty liver   . Hypertension   . Macular degeneration   . Osteoporosis   . Parkinson disease (HCC)     There are no active problems to display for this patient.   Past Surgical History:  Procedure Laterality Date  . ABDOMINAL HYSTERECTOMY    . APPENDECTOMY    . CATARACT EXTRACTION Right   . CHOLECYSTECTOMY    . EYE SURGERY Left   . HERNIA REPAIR     ventral  . ROTATOR CUFF REPAIR Left     OB History    No data available       Home Medications    Prior to Admission medications   Medication Sig Start Date End Date Taking? Authorizing Provider  albuterol (PROVENTIL) (2.5 MG/3ML) 0.083% nebulizer solution Take 2.5 mg by nebulization every 6 (six) hours as needed.    Historical Provider, MD  carbidopa-levodopa (SINEMET IR) 25-100 MG tablet TAKE 1 TABLET BY MOUTH THREE TIMES DAILY 07/06/16   Octaviano Batty Tat, DO  gabapentin (NEURONTIN) 100 MG capsule Take 100 mg by mouth at bedtime.     Historical Provider, MD    ipratropium (ATROVENT) 0.02 % nebulizer solution Take 500 mcg by nebulization 4 (four) times daily.    Historical Provider, MD  losartan-hydrochlorothiazide (HYZAAR) 100-25 MG tablet Take 1 tablet by mouth daily. 09/30/15   Historical Provider, MD  naproxen sodium (ANAPROX) 220 MG tablet Take 220 mg by mouth 2 (two) times daily with a meal.    Historical Provider, MD  sertraline (ZOLOFT) 50 MG tablet  03/23/16   Historical Provider, MD  vitamin B-12 (CYANOCOBALAMIN) 1000 MCG tablet Take 1,000 mcg by mouth daily.    Historical Provider, MD  Vitamin D, Ergocalciferol, (DRISDOL) 50000 units CAPS capsule Take 1 capsule by mouth once a week. 09/30/15   Historical Provider, MD    Family History No family history on file.  Social History Social History  Substance Use Topics  . Smoking status: Former Smoker    Quit date: 07/05/1976  . Smokeless tobacco: Former Neurosurgeon    Quit date: 08/04/1976  . Alcohol use No     Allergies   Ibuprofen; Cepacol [cetylpyridinium]; Crestor [rosuvastatin]; Dual action complete [famotidine-ca carb-mag hydrox]; Fosamax [alendronate sodium]; Penicillins; Pravachol [pravastatin sodium]; Valium [diazepam]; and Zocor [simvastatin]   Review of Systems Review of Systems  Constitutional:       Per HPI, otherwise negative  HENT:  Per HPI, otherwise negative  Respiratory:       Per HPI, otherwise negative  Cardiovascular:       Per HPI, otherwise negative  Gastrointestinal: Negative for vomiting.  Endocrine:       Negative aside from HPI  Genitourinary:       Neg aside from HPI   Musculoskeletal:       Per HPI, otherwise negative  Skin: Negative.   Neurological: Positive for weakness. Negative for syncope.     Physical Exam Updated Vital Signs BP 146/89   Pulse 89   Temp 98.6 F (37 C) (Oral)   Resp 15   SpO2 91%   Physical Exam  Constitutional: She is oriented to person, place, and time. She appears well-developed and well-nourished. No distress.   HENT:  Head: Normocephalic and atraumatic.  Eyes: Conjunctivae and EOM are normal.  Cardiovascular: Normal rate and regular rhythm.   Pulmonary/Chest: Effort normal and breath sounds normal. No stridor. No respiratory distress.  Abdominal: She exhibits no distension.  Musculoskeletal: She exhibits no edema.  No gross pelvis deformity, tenderness to palpation about the right lateral pelvis, patient can flex the left hip spontaneously, independent, though with pain referred to the right lateral pelvis. Patient unwilling to flex the right hip secondary to pain. Both knees, ankles unremarkable.   Neurological: She is alert and oriented to person, place, and time. No cranial nerve deficit.  Skin: Skin is warm and dry.  Psychiatric: She has a normal mood and affect.  Nursing note and vitals reviewed.    ED Treatments / Results   Radiology Ct Pelvis Wo Contrast  Result Date: 07/16/2016 CLINICAL DATA:  Right hip and pelvic pain secondary to a fall out of bed 2 nights ago. History of previous left pelvic fractures in September 2016. EXAM: CT PELVIS WITHOUT CONTRAST TECHNIQUE: Multidetector CT imaging of the pelvis was performed following the standard protocol without intravenous contrast. COMPARISON:  None. FINDINGS: Musculoskeletal: There are old nonunion fractures of the left inferior and superior pubic rami. Old healed fracture of the left sacral ala. No acute fractures. No dislocations. Urinary Tract: 5.2 mm low-density lesion on the lower pole of the left kidney consistent with a cyst. The bladder appears normal. Bowel: Diverticula in the distal colon. Moderate amount of stool in the rectum. Vascular/Lymphatic: Extensive aortic atherosclerosis. No adenopathy. Reproductive:  Uterus and ovaries appear to have been removed. Other:  None IMPRESSION: No acute bone abnormality. 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 Aortic atherosclerosis. Electronically Signed   By:  Francene BoyersJames  Maxwell M.D.   On: 07/16/2016 13:58   Dg Hip Unilat  With Pelvis 2-3 Views Right  Result Date: 07/16/2016 CLINICAL DATA:  Pain following fall EXAM: DG HIP (WITH OR WITHOUT PELVIS) 2-3V RIGHT COMPARISON:  None. FINDINGS: Frontal pelvis as well as frontal and lateral right hip images were obtained. There is an old healed fracture of the midportion left ischium. There is also an old healed fracture of the medial aspect of the left superior pubic ramus No acute fracture or dislocation evident. There is symmetric narrowing of both hip joints, moderate. No erosive change. IMPRESSION: Prior fractures left ischium and medial left superior pubic ramus with remodeling. No acute fracture or dislocation. Moderate symmetric narrowing of both hip joints. Electronically Signed   By: Bretta BangWilliam  Woodruff III M.D.   On: 07/16/2016 12:59    Procedures Procedures (including critical care time)  Medications Ordered in ED Medications  morphine 4  MG/ML injection 4 mg (not administered)     Initial Impression / Assessment and Plan / ED Course  I have reviewed the triage vital signs and the nursing notes.  Pertinent labs & imaging results that were available during my care of the patient were reviewed by me and considered in my medical decision making (see chart for details).  Clinical Course     3:04 PM Now, following return of CT results were discussed the patient's findings with her and family members. The patient's son states that she typically has some degree of pain in that area. We discussed cryotherapy, short-term analgesia, following up with primary care and the importance of physical therapy. Patient states she feels better after initial analgesia here. Without evidence for fracture, with no distal neurovascular compromise, and with the patient's improvement, she is appropriate for discharge with outpatient follow-up.  Final Clinical Impressions(s) / ED Diagnoses  Fall, initial visit   Gerhard Munch, MD 07/16/16 1504

## 2016-08-01 ENCOUNTER — Inpatient Hospital Stay (HOSPITAL_COMMUNITY)
Admission: EM | Admit: 2016-08-01 | Discharge: 2016-08-10 | DRG: 480 | Disposition: A | Discharge status: Skilled Nursing Facility | Payer: Medicare Other | Attending: Internal Medicine | Admitting: Internal Medicine

## 2016-08-01 ENCOUNTER — Observation Stay (HOSPITAL_COMMUNITY): Payer: Medicare Other

## 2016-08-01 ENCOUNTER — Encounter (HOSPITAL_COMMUNITY): Payer: Self-pay | Admitting: *Deleted

## 2016-08-01 ENCOUNTER — Emergency Department (HOSPITAL_COMMUNITY): Payer: Medicare Other

## 2016-08-01 DIAGNOSIS — F05 Delirium due to known physiological condition: Secondary | ICD-10-CM | POA: Diagnosis not present

## 2016-08-01 DIAGNOSIS — D62 Acute posthemorrhagic anemia: Secondary | ICD-10-CM

## 2016-08-01 DIAGNOSIS — R4182 Altered mental status, unspecified: Secondary | ICD-10-CM | POA: Diagnosis not present

## 2016-08-01 DIAGNOSIS — M25751 Osteophyte, right hip: Secondary | ICD-10-CM | POA: Diagnosis present

## 2016-08-01 DIAGNOSIS — L8992 Pressure ulcer of unspecified site, stage 2: Secondary | ICD-10-CM | POA: Diagnosis present

## 2016-08-01 DIAGNOSIS — Z820 Family history of epilepsy and other diseases of the nervous system: Secondary | ICD-10-CM

## 2016-08-01 DIAGNOSIS — R410 Disorientation, unspecified: Secondary | ICD-10-CM

## 2016-08-01 DIAGNOSIS — W19XXXS Unspecified fall, sequela: Secondary | ICD-10-CM | POA: Diagnosis present

## 2016-08-01 DIAGNOSIS — Z419 Encounter for procedure for purposes other than remedying health state, unspecified: Secondary | ICD-10-CM

## 2016-08-01 DIAGNOSIS — G20A1 Parkinson's disease without dyskinesia, without mention of fluctuations: Secondary | ICD-10-CM

## 2016-08-01 DIAGNOSIS — Z9049 Acquired absence of other specified parts of digestive tract: Secondary | ICD-10-CM

## 2016-08-01 DIAGNOSIS — Z09 Encounter for follow-up examination after completed treatment for conditions other than malignant neoplasm: Secondary | ICD-10-CM

## 2016-08-01 DIAGNOSIS — K5900 Constipation, unspecified: Secondary | ICD-10-CM | POA: Diagnosis present

## 2016-08-01 DIAGNOSIS — H3530 Unspecified macular degeneration: Secondary | ICD-10-CM | POA: Diagnosis present

## 2016-08-01 DIAGNOSIS — J45909 Unspecified asthma, uncomplicated: Secondary | ICD-10-CM | POA: Diagnosis not present

## 2016-08-01 DIAGNOSIS — S72141S Displaced intertrochanteric fracture of right femur, sequela: Secondary | ICD-10-CM | POA: Diagnosis not present

## 2016-08-01 DIAGNOSIS — Z66 Do not resuscitate: Secondary | ICD-10-CM | POA: Diagnosis present

## 2016-08-01 DIAGNOSIS — Z90710 Acquired absence of both cervix and uterus: Secondary | ICD-10-CM

## 2016-08-01 DIAGNOSIS — M810 Age-related osteoporosis without current pathological fracture: Secondary | ICD-10-CM

## 2016-08-01 DIAGNOSIS — E860 Dehydration: Secondary | ICD-10-CM | POA: Diagnosis present

## 2016-08-01 DIAGNOSIS — E871 Hypo-osmolality and hyponatremia: Secondary | ICD-10-CM | POA: Diagnosis present

## 2016-08-01 DIAGNOSIS — Z880 Allergy status to penicillin: Secondary | ICD-10-CM

## 2016-08-01 DIAGNOSIS — G20 Parkinson's disease: Secondary | ICD-10-CM | POA: Diagnosis not present

## 2016-08-01 DIAGNOSIS — K760 Fatty (change of) liver, not elsewhere classified: Secondary | ICD-10-CM | POA: Diagnosis present

## 2016-08-01 DIAGNOSIS — Z87891 Personal history of nicotine dependence: Secondary | ICD-10-CM

## 2016-08-01 DIAGNOSIS — E440 Moderate protein-calorie malnutrition: Secondary | ICD-10-CM | POA: Insufficient documentation

## 2016-08-01 DIAGNOSIS — J9601 Acute respiratory failure with hypoxia: Secondary | ICD-10-CM | POA: Diagnosis not present

## 2016-08-01 DIAGNOSIS — Z539 Procedure and treatment not carried out, unspecified reason: Secondary | ICD-10-CM | POA: Diagnosis present

## 2016-08-01 DIAGNOSIS — N189 Chronic kidney disease, unspecified: Secondary | ICD-10-CM | POA: Diagnosis present

## 2016-08-01 DIAGNOSIS — R262 Difficulty in walking, not elsewhere classified: Secondary | ICD-10-CM

## 2016-08-01 DIAGNOSIS — M25551 Pain in right hip: Secondary | ICD-10-CM

## 2016-08-01 DIAGNOSIS — G510 Bell's palsy: Secondary | ICD-10-CM | POA: Diagnosis not present

## 2016-08-01 DIAGNOSIS — I110 Hypertensive heart disease with heart failure: Secondary | ICD-10-CM | POA: Diagnosis present

## 2016-08-01 DIAGNOSIS — I10 Essential (primary) hypertension: Secondary | ICD-10-CM | POA: Diagnosis not present

## 2016-08-01 DIAGNOSIS — S72101P Unspecified trochanteric fracture of right femur, subsequent encounter for closed fracture with malunion: Secondary | ICD-10-CM | POA: Diagnosis present

## 2016-08-01 DIAGNOSIS — Z79899 Other long term (current) drug therapy: Secondary | ICD-10-CM

## 2016-08-01 DIAGNOSIS — I5032 Chronic diastolic (congestive) heart failure: Secondary | ICD-10-CM | POA: Diagnosis present

## 2016-08-01 DIAGNOSIS — Z888 Allergy status to other drugs, medicaments and biological substances status: Secondary | ICD-10-CM

## 2016-08-01 DIAGNOSIS — S72141A Displaced intertrochanteric fracture of right femur, initial encounter for closed fracture: Secondary | ICD-10-CM | POA: Diagnosis not present

## 2016-08-01 DIAGNOSIS — Z9114 Patient's other noncompliance with medication regimen: Secondary | ICD-10-CM

## 2016-08-01 DIAGNOSIS — Z7401 Bed confinement status: Secondary | ICD-10-CM

## 2016-08-01 DIAGNOSIS — L89152 Pressure ulcer of sacral region, stage 2: Secondary | ICD-10-CM | POA: Diagnosis present

## 2016-08-01 DIAGNOSIS — S72001A Fracture of unspecified part of neck of right femur, initial encounter for closed fracture: Secondary | ICD-10-CM | POA: Diagnosis present

## 2016-08-01 DIAGNOSIS — H9190 Unspecified hearing loss, unspecified ear: Secondary | ICD-10-CM | POA: Diagnosis present

## 2016-08-01 DIAGNOSIS — R4781 Slurred speech: Secondary | ICD-10-CM | POA: Diagnosis present

## 2016-08-01 DIAGNOSIS — Z6822 Body mass index (BMI) 22.0-22.9, adult: Secondary | ICD-10-CM

## 2016-08-01 DIAGNOSIS — L8990 Pressure ulcer of unspecified site, unspecified stage: Secondary | ICD-10-CM | POA: Insufficient documentation

## 2016-08-01 DIAGNOSIS — Z9181 History of falling: Secondary | ICD-10-CM

## 2016-08-01 LAB — URINALYSIS, ROUTINE W REFLEX MICROSCOPIC
Bilirubin Urine: NEGATIVE
Glucose, UA: NEGATIVE mg/dL
Hgb urine dipstick: NEGATIVE
Ketones, ur: 20 mg/dL — AB
Leukocytes, UA: NEGATIVE
NITRITE: NEGATIVE
PH: 5 (ref 5.0–8.0)
Protein, ur: NEGATIVE mg/dL
SPECIFIC GRAVITY, URINE: 1.033 — AB (ref 1.005–1.030)

## 2016-08-01 LAB — COMPREHENSIVE METABOLIC PANEL
ALK PHOS: 151 U/L — AB (ref 38–126)
ALT: 14 U/L (ref 14–54)
AST: 24 U/L (ref 15–41)
Albumin: 3.5 g/dL (ref 3.5–5.0)
Anion gap: 13 (ref 5–15)
BUN: 27 mg/dL — ABNORMAL HIGH (ref 6–20)
CALCIUM: 8.7 mg/dL — AB (ref 8.9–10.3)
CO2: 23 mmol/L (ref 22–32)
CREATININE: 0.52 mg/dL (ref 0.44–1.00)
Chloride: 95 mmol/L — ABNORMAL LOW (ref 101–111)
GFR calc non Af Amer: >60 mL/min (ref >60)
Glucose, Bld: 80 mg/dL (ref 65–99)
Potassium: 3.6 mmol/L (ref 3.5–5.1)
SODIUM: 131 mmol/L — AB (ref 135–145)
Total Bilirubin: 1 mg/dL (ref 0.3–1.2)
Total Protein: 6.5 g/dL (ref 6.5–8.1)

## 2016-08-01 LAB — I-STAT TROPONIN, ED: TROPONIN I, POC: 0.02 ng/mL (ref 0.00–0.08)

## 2016-08-01 LAB — PROTIME-INR
INR: 0.98
PROTHROMBIN TIME: 13 s (ref 11.4–15.2)

## 2016-08-01 LAB — CBC WITH DIFFERENTIAL/PLATELET
Basophils Absolute: 0 10*3/uL (ref 0.0–0.1)
Basophils Relative: 0 %
EOS ABS: 0 10*3/uL (ref 0.0–0.7)
Eosinophils Relative: 0 %
HCT: 39.5 % (ref 36.0–46.0)
Hemoglobin: 13.4 g/dL (ref 12.0–15.0)
LYMPHS ABS: 1.3 10*3/uL (ref 0.7–4.0)
Lymphocytes Relative: 10 %
MCH: 30.5 pg (ref 26.0–34.0)
MCHC: 33.9 g/dL (ref 30.0–36.0)
MCV: 89.8 fL (ref 78.0–100.0)
Monocytes Absolute: 0.7 10*3/uL (ref 0.1–1.0)
Monocytes Relative: 6 %
Neutro Abs: 10.7 10*3/uL — ABNORMAL HIGH (ref 1.7–7.7)
Neutrophils Relative %: 84 %
Platelets: 476 10*3/uL — ABNORMAL HIGH (ref 150–400)
RBC: 4.4 MIL/uL (ref 3.87–5.11)
RDW: 13.8 % (ref 11.5–15.5)
WBC: 12.7 10*3/uL — AB (ref 4.0–10.5)

## 2016-08-01 LAB — I-STAT CG4 LACTIC ACID, ED: Lactic Acid, Venous: 1.48 mmol/L (ref 0.5–1.9)

## 2016-08-01 LAB — AMMONIA: AMMONIA: 22 umol/L (ref 9–35)

## 2016-08-01 MED ORDER — ACETAMINOPHEN 650 MG RE SUPP: 650.0000 mg | Freq: Four times a day (QID) | RECTAL | Status: DC | PRN

## 2016-08-01 MED ORDER — SODIUM CHLORIDE 0.9 % IV SOLN
INTRAVENOUS | Status: DC
  Administered 2016-08-01: 18:00:00 via INTRAVENOUS
  Administered 2016-08-02: 75 mL/h via INTRAVENOUS

## 2016-08-01 MED ORDER — NAPROXEN SODIUM 220 MG PO TABS: 220.0000 mg | ORAL_TABLET | Freq: Two times a day (BID) | ORAL | Status: DC | PRN

## 2016-08-01 MED ORDER — CARBIDOPA-LEVODOPA 25-100 MG PO TABS
1.0000 | ORAL_TABLET | Freq: Three times a day (TID) | ORAL | Status: DC
  Administered 2016-08-01 – 2016-08-10 (×26): 1 via ORAL
  Filled 2016-08-01 (×26): qty 1

## 2016-08-01 MED ORDER — NAPROXEN 250 MG PO TABS
250.0000 mg | ORAL_TABLET | Freq: Two times a day (BID) | ORAL | Status: DC | PRN
Start: 2016-08-01 — End: 2016-08-06
  Administered 2016-08-04 – 2016-08-06 (×2): 250 mg via ORAL
  Filled 2016-08-01 (×2): qty 1

## 2016-08-01 MED ORDER — ENOXAPARIN SODIUM 30 MG/0.3ML ~~LOC~~ SOLN
30.0000 mg | SUBCUTANEOUS | Status: DC
  Administered 2016-08-01 – 2016-08-02 (×2): 30 mg via SUBCUTANEOUS
  Filled 2016-08-01 (×2): qty 0.3

## 2016-08-01 MED ORDER — SODIUM CHLORIDE 0.9 % IV BOLUS (SEPSIS)
500.0000 mL | Freq: Once | INTRAVENOUS | Status: AC
Start: 2016-08-01 — End: 2016-08-01
  Administered 2016-08-01: 500 mL via INTRAVENOUS

## 2016-08-01 MED ORDER — ACETAMINOPHEN 325 MG PO TABS
650.0000 mg | ORAL_TABLET | Freq: Four times a day (QID) | ORAL | Status: DC | PRN
  Administered 2016-08-02 – 2016-08-04 (×2): 650 mg via ORAL
  Filled 2016-08-01 (×3): qty 2

## 2016-08-01 MED ORDER — ALBUTEROL SULFATE (2.5 MG/3ML) 0.083% IN NEBU
2.5000 mg | INHALATION_SOLUTION | Freq: Four times a day (QID) | RESPIRATORY_TRACT | Status: DC | PRN
  Administered 2016-08-06: 2.5 mg via RESPIRATORY_TRACT
  Filled 2016-08-01: qty 3

## 2016-08-01 MED ORDER — IPRATROPIUM BROMIDE 0.02 % IN SOLN: 500.0000 ug | RESPIRATORY_TRACT | Status: DC | PRN

## 2016-08-01 NOTE — Care Management Note (Signed)
Case Management Note  Patient Details  Name: Patricia Hopkins MRN: [id number] Date of Birth: 03-Aug-1931  Subjective/Objective:                  PMH of asthma, CKD, HTN, and parkinson's disease presents to the emergency department for evaluation of altered mental status for the past 2-3 weeks.   Action/Plan: Received telephone call from Dr. Jacqulyn BathLong and case discussed.   Dr. Jacqulyn BathLong states [facility], complete face to face, and plan for patient to be discharged today,  home with daughter, and not be admitted inpatient. RNCM spoke with patient and received verbal permission to speak with daughter Patricia Carrow(Angie Lyman) regarding her healthcare needs.  Spoke with Ms. Patricia Hopkins, states patient is currently staying at daughter's home ( [address], [place], [place] [address]) and will return to daughter's home post discharge.   Ms. [name]'s home number is [phone].   Ms. Patricia Hopkins also states she is patient's POA.  Daughter states patient does not have any DME or other CM needs at this time. [facility] health [facility] [phone]( [phone]).  Spoke with patient's nurse advised of above and AVS updated. Spoke with Dr. Jacqulyn BathLong and updated on above.   States patient will stay overnight in observation status.  [facility] [phone]) advised patient will be staying in the hospital overnight.  States she will notify her hospital liaison (Patricia Hopkins) to follow up for patient's discharge in am.    Expected Discharge Date:  08/02/16               Expected Discharge Plan:  Home w Home Health Services  In-House Referral:  NA  Discharge planning Services  CM Consult  Post Acute Care Choice:  Home Health Choice offered to:  Adult Children (Spoke with patient's daughter Patricia Carrowngie Lyman per patient's request and choice given.)  DME Arranged:    DME Agency:     HH Arranged:  RN, PT, OT, Nurse's Aide, Social  Work [employer] Agency:  [facility]  Status of Service:  Completed, signed off  If discussed at [employer] of [employer] Meetings, dates discussed:    Additional Comments:  Shelda PalDarlene H Hara Milholland, RN 08/01/2016, 3:38 PM

## 2016-08-01 NOTE — ED Triage Notes (Signed)
Pt from her daughters home where she was staying.  Pt began having ALOC about 10days ago and was seen by her PCP and had labs.  She was found to have UTI and placed on cipro which she has been taken as prescribed, this UTI was diagnosed clinically and not with urine sample thus urine culture was not done.  Pt has been on cipro since 1/23 per daughter.  Pt has continued to decline.  Daugher is also worried about what her ammonia level might be as there is a family history for this.  Pt is alert, follows commands at this time and at times she is verbal (varied mental state per daughter) she had a fall 1/10 which resulted in bruising (no other injuries)

## 2016-08-01 NOTE — ED Notes (Signed)
Bed: ZO10WA14 Expected date:  Expected time:  Means of arrival:  Comments: Sepsis

## 2016-08-01 NOTE — ED Provider Notes (Signed)
Emergency Department Provider Note   I have reviewed the triage vital signs and the nursing notes.   HISTORY  Chief Complaint Altered Mental Status   HPI Patricia Hopkins is a 83 y.o. female with PMH of asthma, CKD, HTN, and parkinson's disease presents to the emergency department for evaluation of altered mental status for the past 2-3 weeks. Patient was initially seen in the emergency department after a mechanical fall with right hip pain. The daughter states that the did an x-ray and saw no fracture. No concern for head trauma during that encounter. Patient was prescribed Vicodin and according to the daughter she began having hallucinations and confusion while taking the medications was discontinued. This other primary care doctor who diagnosed a urinary tract infection with report of foul-smelling dark urine. During that visit, blood work was obtained which showed a mild leukocytosis and she was started on Cipro. They were unable to obtain a urine sample from the patient at that time. Since then she has been on Cipro but continues to get worse. The daughter states she is talking to dead relatives and is much more confused than normal. She is weak and unable to walk. No additional falls.    Past Medical History:  Diagnosis Date  . Arthritis   . Asthma   . Bell's palsy 1959   R side  . Chronic kidney disease   . Fatty liver   . Hypertension   . Macular degeneration   . Osteoporosis   . Parkinson disease [facility](HCC)     Patient Active Problem List   Diagnosis Date Noted  . Altered mental state 08/01/2016  . Asthma 08/01/2016  . Essential hypertension 08/01/2016  . Osteoporosis 08/01/2016  . Bell's palsy 08/01/2016  . Parkinson disease (HCC) 08/01/2016    Past Surgical History:  Procedure Laterality Date  . ABDOMINAL HYSTERECTOMY    . APPENDECTOMY    . CATARACT EXTRACTION Right   . CHOLECYSTECTOMY    . EYE SURGERY Left   . HERNIA REPAIR     ventral  . ROTATOR CUFF  REPAIR Left       Allergies Cepacol [cetylpyridinium]; Crestor [rosuvastatin]; Dual action complete [famotidine-ca carb-mag hydrox]; Fosamax [alendronate sodium]; Penicillins; Pravachol [pravastatin sodium]; Valium [diazepam]; and Zocor [simvastatin]  Family History  Problem Relation Age of Onset  . Parkinson's disease Daughter     Social History Social History  Substance Use Topics  . Smoking status: Former Smoker    Quit date: 07/05/1976  . Smokeless tobacco: Former NeurosurgeonUser    Quit date: 08/04/1976  . Alcohol use No    Review of Systems  Level 5 caveat: Limited by confusion.   ____________________________________________   PHYSICAL EXAM:  VITAL SIGNS: ED Triage Vitals  Enc Vitals Group     BP 08/01/16 1235 130/73     Pulse Rate 08/01/16 1235 88     Resp 08/01/16 1235 14     Temp 08/01/16 1235 98.3 F (36.8 C)     Temp Source 08/01/16 1235 Oral     SpO2 08/01/16 1235 100 %     Weight 08/01/16 1238 100 lb (45.4 kg)     Pain Score 08/01/16 1239 0   Constitutional: Alert but notably confused and hard of hearing. No acute distress. Chronically ill-appearing.  Eyes: Conjunctivae are normal. PERRL.  Head: Atraumatic. Nose: No congestion/rhinnorhea. Mouth/Throat: Mucous membranes are dry.  Neck: No stridor.   Cardiovascular: Normal rate, regular rhythm. Good peripheral circulation. Grossly normal heart sounds.  Respiratory: Normal respiratory effort.  No retractions. Lungs CTAB. Gastrointestinal: Soft and nontender. No distention.  Musculoskeletal: No lower extremity tenderness nor edema. No gross deformities of extremities. Neurologic: No gross focal neurologic deficits are appreciated.  Skin:  Skin is warm, dry and intact. No rash noted.  ____________________________________________   LABS (all labs ordered are listed, but only abnormal results are displayed)  Labs Reviewed  COMPREHENSIVE METABOLIC PANEL - Abnormal; Notable for the following:       Result Value    Sodium 131 (*)    Chloride 95 (*)    BUN 27 (*)    Calcium 8.7 (*)    Alkaline Phosphatase 151 (*)    All other components within normal limits  CBC WITH DIFFERENTIAL/PLATELET - Abnormal; Notable for the following:    WBC 12.7 (*)    Platelets 476 (*)    Neutro Abs 10.7 (*)    All other components within normal limits  URINALYSIS, ROUTINE W REFLEX MICROSCOPIC - Abnormal; Notable for the following:    Color, Urine AMBER (*)    Specific Gravity, Urine 1.033 (*)    Ketones, ur 20 (*)    All other components within normal limits  CULTURE, BLOOD (ROUTINE X 2)  CULTURE, BLOOD (ROUTINE X 2)  URINE CULTURE  PROTIME-INR  AMMONIA  COMPREHENSIVE METABOLIC PANEL  CBC  I-STAT CG4 LACTIC ACID, ED  I-STAT TROPOININ, ED   ____________________________________________  RADIOLOGY  Dg Chest 2 View  Result Date: 08/01/2016 CLINICAL DATA:  83 y/o  F; confusion and weakness. EXAM: CHEST  2 VIEW COMPARISON:  07/06/2015 chest radiograph FINDINGS: Stable mild cardiomegaly. Tortuous aorta and aortic atherosclerosis with calcification. Question small hiatal hernia. A chronic right-sided rib fractures again noted. Deformity of left proximal humerus probably representing sequelae of old fracture. Linear opacity at the left lung base is probably atelectasis. No focal consolidation. No pleural effusion. IMPRESSION: Stable cardiomegaly. Mild aortic atherosclerosis. Question small hiatal hernia. Left basilar atelectasis. Electronically Signed   By: Mitzi Hansen M.D.   On: 08/01/2016 13:58   Ct Head Wo Contrast  Result Date: 08/01/2016 CLINICAL DATA:  Patient with altered level of consciousness 10 days prior. Urinary tract infection. EXAM: CT HEAD WITHOUT CONTRAST TECHNIQUE: Contiguous axial images were obtained from the base of the skull through the vertex without intravenous contrast. COMPARISON:  Brain CT 07/12/2009 FINDINGS: Brain: Ventricles and sulci are prominent compatible with atrophy.  Periventricular and subcortical white matter hypodensity compatible with chronic microvascular ischemic changes. No evidence for acute cortically based infarct, intracranial hemorrhage, mass lesion or mass-effect. Vascular: Unremarkable. Skull: Intact. Sinuses/Orbits: Paranasal sinuses and mastoid air cells are unremarkable. Orbits are unremarkable. Other: None. IMPRESSION: No acute intracranial process. Cortical atrophy and chronic microvascular ischemic changes. Electronically Signed   By: Annia Belt M.D.   On: 08/01/2016 17:07    ____________________________________________   PROCEDURES  Procedure(s) performed:   Procedures    EKG Interpretation  Date/Time:  Sunday August 01 2016 15:59:51 EST Ventricular Rate:  83 PR Interval:    QRS Duration: 82 QT Interval:  384 QTC Calculation: 452 R Axis:   -62 Text Interpretation:  Sinus rhythm Atrial premature complex Inferior infarct, old Consider anterior infarct No STEMI.  Confirmed by Shahidah Nesbitt MD, Michel Eskelson [phone]) on 08/01/2016 6:56:52 PM       ____________________________________________   INITIAL IMPRESSION / ASSESSMENT AND PLAN / ED COURSE  Pertinent labs & imaging results that were available during my care of the patient were reviewed by me and  considered in my medical decision making (see chart for details).  Patient with PMH of parkinson's but no history of dementia presents to the emergency department for evaluation of generalized weakness, confusion, and visual hallucinations. This in the setting of starting multiple new medications including Cipro for presumed urinary infection. Vicodin was stopped at the start of Cipro. Patient is somewhat confused and hard of hearing but is able to answer my questions. She appears chronically ill but in no acute distress. Plan for labs including blood culture, urinalysis, pneumonia, and chest x-ray. Patient tested negative for the flu the past several days at her primary care physician's office  and does not have any obvious URI symptoms at this time.   02:56 PM Labs are largely unremarkable. No clear reason for the patient's confusion. I suspect medication side effect first from the Vicodin and possibly from the Cipro. Both medications have been stopped. The patient is awake and alert. She is answering my questions. No focal neurological deficits. By report she is not recognizing family at bedside. I had a discussion with the daughter about possible need for home health and even consideration of nursing home placement given the patient's multiple medical co-morbidities. I discussed the case like to have the case manager see them and discuss home health options to assist with her care at home.   03:44 PM Had discussion with family regarding patient. They are concerned that she is not eating or drinking at home and is too weak to walk. Will discuss admission with the hospitalist for IVF and acute delirium.   Discussed patient's case with hospitlaist, Dr. Mal Misty.  Recommend admission to med-surg, obs bed.  I will place holding orders per their request. Patient and family (if present) updated with plan. Care transferred to hospitlaist service.  I reviewed all nursing notes, vitals, pertinent old records, EKGs, labs, imaging (as available).  ___________________________________________  FINAL CLINICAL IMPRESSION(S) / ED DIAGNOSES  Final diagnoses:  Disorientation     MEDICATIONS GIVEN DURING THIS VISIT:  Medications  carbidopa-levodopa (SINEMET IR) 25-100 MG per tablet immediate release 1 tablet (1 tablet Oral Given 08/01/16 1841)  albuterol (PROVENTIL) (2.5 MG/3ML) 0.083% nebulizer solution 2.5 mg (not administered)  ipratropium (ATROVENT) nebulizer solution 500 mcg (not administered)  enoxaparin (LOVENOX) injection 30 mg (not administered)  0.9 %  sodium chloride infusion ( Intravenous New Bag/Given 08/01/16 1732)  acetaminophen (TYLENOL) tablet 650 mg (not administered)    Or    acetaminophen (TYLENOL) suppository 650 mg (not administered)  naproxen (NAPROSYN) tablet 250 mg (not administered)  sodium chloride 0.9 % bolus 500 mL (500 mLs Intravenous New Bag/Given 08/01/16 1519)     NEW OUTPATIENT MEDICATIONS STARTED DURING THIS VISIT:  None   Note:  This document was prepared using Dragon voice recognition software and may include unintentional dictation errors.  Alona Bene, MD Emergency Medicine   Maia Plan, MD 08/01/16 [phone]

## 2016-08-01 NOTE — H&P (Addendum)
History and Physical    Patricia Hopkins JWJ:[id number] DOB: 1931-11-08 DOA: 08/01/2016  PCP: Frederich Chick, MD Patient coming from: Home  Chief Complaint: Altered mental status  History provided by patient's daughter mostly as patient states she can't remember most of the details.  HPI: Patricia Hopkins is a 83 y.o. female with medical history significant of Parkinson disease, asthma, Bell's palsy, essential hypertension, osteoporosis. Three weeks ago, the patient had a fall onto her hip. She went to the emergency department secondary to right hip pain which was negative for fracture. She was prescribed Vicodin and about three days later, started acting different and "loopy." About one week later, she went to see her primary care physician, who diagnosed a UTI secondary to altered mental status, foul smelling urine and elevated white count and treated her with ciprofloxacin. Since then, her hallucinations have been worsening. Vicodin was stopped about two weeks ago. Ciprofloxacin was discontinued yesterday. Family feels that she has been getting worse since three weeks ago overall.  ED Course: Vitals: Afebrile, normal pulse, normal respiratory rate, on room air at 100% Labs: WBC of 12.7, sodium of 131, BUN of 27, Alkaline phosphatase of 151. Ammonia wnl Imaging: CXR significant for cardiomegaly, small hiatal hernia and left basilar atelectasis Medications/Course: NS bolus.  Review of Systems: Review of Systems  Constitutional: Negative for chills and fever.  Respiratory: Negative for cough, sputum production and shortness of breath.   Cardiovascular: Negative for chest pain.  Gastrointestinal: Negative for abdominal pain, constipation, diarrhea, nausea and vomiting.  Genitourinary: Negative for dysuria, frequency and urgency.  Musculoskeletal: Positive for falls and joint pain. Negative for myalgias.  Neurological: Positive for tremors and speech change (three weeks). Negative  for focal weakness.    Past Medical History:  Diagnosis Date  . Arthritis   . Asthma   . Bell's palsy 1959   R side  . Chronic kidney disease   . Fatty liver   . Hypertension   . Macular degeneration   . Osteoporosis   . Parkinson disease [facility])     Past Surgical History:  Procedure Laterality Date  . ABDOMINAL HYSTERECTOMY    . APPENDECTOMY    . CATARACT EXTRACTION Right   . CHOLECYSTECTOMY    . EYE SURGERY Left   . HERNIA REPAIR     ventral  . ROTATOR CUFF REPAIR Left      reports that she quit smoking about 40 years ago. She quit smokeless tobacco use about 40 years ago. She reports that she does not drink alcohol or use drugs.  Allergies  Allergen Reactions  . Cepacol [Cetylpyridinium] Other (See Comments)    unknown  . Crestor [Rosuvastatin] Other (See Comments)    No energy  . Dual Action Complete [Famotidine-Ca Carb-Mag Hydrox] Other (See Comments)    unknown  . Fosamax [Alendronate Sodium]     Nausea     Has taken Reclast  One other time did not have any problems  . Penicillins Other (See Comments)    Has patient had a PCN reaction causing immediate rash, facial/tongue/throat swelling, SOB or lightheadedness with hypotension: unknown Has patient had a PCN reaction causing severe rash involving mucus membranes or skin necrosis:unknown Has patient had a PCN reaction that required hospitalization : unknown Has patient had a PCN reaction occurring within the last 10 years: no If all of the above answers are "NO", then may proceed with Cephalosporin use.   Earnstine Regal Sodium] Other (See Comments)  .  Valium [Diazepam] Other (See Comments)    Affected her mind  . Zocor [Simvastatin] Other (See Comments)    unknown    Family History  Problem Relation Age of Onset  . Parkinson's disease Daughter     Prior to Admission medications   Medication Sig Start Date End Date Taking? Authorizing Provider  carbidopa-levodopa (SINEMET IR) 25-100 MG tablet  TAKE 1 TABLET BY MOUTH THREE TIMES DAILY 07/06/16  Yes Rebecca S Tat, DO  losartan-hydrochlorothiazide (HYZAAR) 100-25 MG tablet Take 1 tablet by mouth daily. 09/30/15  Yes Historical Provider, MD  naproxen sodium (ANAPROX) 220 MG tablet Take 220 mg by mouth 2 (two) times daily as needed (pain).    Yes Historical Provider, MD  vitamin B-12 (CYANOCOBALAMIN) 1000 MCG tablet Take 1,000 mcg by mouth daily.   Yes Historical Provider, MD  albuterol (PROVENTIL) (2.5 MG/3ML) 0.083% nebulizer solution Take 2.5 mg by nebulization every 6 (six) hours as needed for wheezing or shortness of breath.     Historical Provider, MD  escitalopram (LEXAPRO) 5 MG tablet Take 5 mg by mouth daily. 07/12/16   Historical Provider, MD  HYDROcodone-acetaminophen (NORCO/VICODIN) 5-325 MG tablet Take 1 tablet by mouth every 6 (six) hours as needed for severe pain. 07/16/16   Gerhard Munch, MD  ipratropium (ATROVENT) 0.02 % nebulizer solution Take 500 mcg by nebulization every 4 (four) hours as needed for wheezing or shortness of breath.     Historical Provider, MD    Physical Exam: Vitals:   08/01/16 1235 08/01/16 1238 08/01/16 1514  BP: 130/73  140/73  Pulse: 88  96  Resp: 14  14  Temp: 98.3 F (36.8 C)    TempSrc: Oral    SpO2: 100%  95%  Weight:  45.4 kg (100 lb)      Constitutional: NAD, calm, comfortable Eyes: PERRL, lids and conjunctivae normal ENMT: Mucous membranes are dry. Posterior pharynx clear of any exudate or lesions. Upper dentures Neck: normal, supple, no masses, no thyromegaly Respiratory: clear to auscultation bilaterally, no wheezing, no crackles. Normal respiratory effort. No accessory muscle use.  Cardiovascular: Regular rate and rhythm, no murmurs / rubs / gallops. No extremity edema. 2+ pedal pulses. Abdomen: no tenderness, no masses palpated. Bowel sounds positive.  Musculoskeletal: no clubbing / cyanosis. Right hip internally rotated. Extremities are somewhat rigid with stepwise rigidity of  bilateral upper extremities. Decreased muscle mass Skin: no lesions or ulcers. No induration Neurologic: CN 2-12 grossly intact except some mild right lip droop. Sensation intact, DTR normal. Strength 4/5 in upper extremities; 2/5 in lower extremities Psychiatric: Normal judgment and insight. Alert and oriented only to self. Normal mood. Flat affect   Labs on Admission: I have personally reviewed following labs and imaging studies  CBC:  Recent Labs Lab 08/01/16 1300  WBC 12.7*  NEUTROABS 10.7*  HGB 13.4  HCT 39.5  MCV 89.8  PLT 476*   Basic Metabolic Panel:  Recent Labs Lab 08/01/16 1300  NA 131*  K 3.6  CL 95*  CO2 23  GLUCOSE 80  BUN 27*  CREATININE 0.52  CALCIUM 8.7*   GFR: CrCl cannot be calculated (Unknown ideal weight.). Liver Function Tests:  Recent Labs Lab 08/01/16 1300  AST 24  ALT 14  ALKPHOS 151*  BILITOT 1.0  PROT 6.5  ALBUMIN 3.5   No results for input(s): LIPASE, AMYLASE in the last 168 hours.  Recent Labs Lab 08/01/16 1309  AMMONIA 22   Coagulation Profile:  Recent Labs Lab 08/01/16 1300  INR 0.98   Cardiac Enzymes: No results for input(s): CKTOTAL, CKMB, CKMBINDEX, TROPONINI in the last 168 hours. BNP (last 3 results) No results for input(s): PROBNP in the last 8760 hours. HbA1C: No results for input(s): HGBA1C in the last 72 hours. CBG: No results for input(s): GLUCAP in the last 168 hours. Lipid Profile: No results for input(s): CHOL, HDL, LDLCALC, TRIG, CHOLHDL, LDLDIRECT in the last 72 hours. Thyroid Function Tests: No results for input(s): TSH, T4TOTAL, FREET4, T3FREE, THYROIDAB in the last 72 hours. Anemia Panel: No results for input(s): VITAMINB12, FOLATE, FERRITIN, TIBC, IRON, RETICCTPCT in the last 72 hours. Urine analysis:    Component Value Date/Time   COLORURINE AMBER (A) 08/01/2016 1300   APPEARANCEUR CLEAR 08/01/2016 1300   LABSPEC 1.033 (H) 08/01/2016 1300   PHURINE 5.0 08/01/2016 1300   GLUCOSEU  NEGATIVE 08/01/2016 1300   HGBUR NEGATIVE 08/01/2016 1300   BILIRUBINUR NEGATIVE 08/01/2016 1300   KETONESUR 20 (A) 08/01/2016 1300   PROTEINUR NEGATIVE 08/01/2016 1300   NITRITE NEGATIVE 08/01/2016 1300   LEUKOCYTESUR NEGATIVE 08/01/2016 1300   Sepsis Labs: !!!!!!!!!!!!!!!!!!!!!!!!!!!!!!!!!!!!!!!!!!!! @LABRCNTIP (procalcitonin:4,lacticidven:4) )No results found for this or any previous visit (from the past 240 hour(s)).   Radiological Exams on Admission: Dg Chest 2 View  Result Date: 08/01/2016 CLINICAL DATA:  83 y/o  F; confusion and weakness. EXAM: CHEST  2 VIEW COMPARISON:  07/06/2015 chest radiograph FINDINGS: Stable mild cardiomegaly. Tortuous aorta and aortic atherosclerosis with calcification. Question small hiatal hernia. A chronic right-sided rib fractures again noted. Deformity of left proximal humerus probably representing sequelae of old fracture. Linear opacity at the left lung base is probably atelectasis. No focal consolidation. No pleural effusion. IMPRESSION: Stable cardiomegaly. Mild aortic atherosclerosis. Question small hiatal hernia. Left basilar atelectasis. Electronically Signed   By: Mitzi HansenLance  Furusawa-Stratton M.D.   On: 08/01/2016 13:58    EKG: Independently reviewed. T-wave flattening in anterior leads unchanged from previous. Sinus rhythm.  Assessment/Plan Active Problems:   Altered mental state   Asthma   Essential hypertension   Osteoporosis   Bell's palsy   Altered mental status Possibly secondary to recent ciprofloxacin use, although was present after fall and Vicodin use. Possibly secondary to progression of parkinson disease with some behavioral abnormalities manifesting. Possible this could also be secondary to pain from fail. EKG unremarkable from previous. Patient also appears dehydrated on exam. -CT head -IVF resuscitation -PT eval  Slurred speech Unknown etiology. Has been present for at least three weeks. Neuro exam is non-focal. CT scan for  altered mental status.  Right hip pain Causing patient significant morbidity as she is not walking anymore (used to walk with a walker). She is now also less autonomous as she used to live on her on at an independent living facility. She may be sensitive to analgesics. -Tylenol 650mg  q6hrs -continue naproxen prn  Parkinson disease Patient has not been taking her medications -continue Sinemet -continue Lexapro  Chronic diastolic heart failure Last EF of 60-65% on 11/26/2015 with grade 1 diastolic dysfunction. Hypovolemic on physical exam. -daily weights -hold losartan while dehydrated  Hyponatremia Likely secondary to dehydration. Already given a fluid bolus in the ED -blood/urine osmolality -urine sodium -IVF as above  Essential hypertension -hold losartan-hydrochlorothiazide in setting of dehydration  Asthma Stable -continue Atrovent -continue Albuterol  Osteoporosis Not on therapy. Recent fall without fracture.  Bells palsy Chronic right facial droop.   DVT prophylaxis: Lovenox Code Status: DNR Family Communication: two sisters, grand daughter and grand son at bedside Disposition Plan: Anticipate  discharge home tomorrow Consults called: None Admission status: Observation, medical floor   Jacquelin Hawking, MD [facility] Pager [phone]  If 7PM-7AM, please contact night-coverage [email or web address] Password [facility]  08/01/2016, 3:54 PM

## 2016-08-02 DIAGNOSIS — G20 Parkinson's disease: Secondary | ICD-10-CM | POA: Diagnosis not present

## 2016-08-02 DIAGNOSIS — I10 Essential (primary) hypertension: Secondary | ICD-10-CM | POA: Diagnosis not present

## 2016-08-02 DIAGNOSIS — R4182 Altered mental status, unspecified: Secondary | ICD-10-CM | POA: Diagnosis not present

## 2016-08-02 DIAGNOSIS — M810 Age-related osteoporosis without current pathological fracture: Secondary | ICD-10-CM | POA: Diagnosis not present

## 2016-08-02 LAB — CBC
HEMATOCRIT: 35.4 % — AB (ref 36.0–46.0)
HEMOGLOBIN: 11.8 g/dL — AB (ref 12.0–15.0)
MCH: 30.2 pg (ref 26.0–34.0)
MCHC: 33.3 g/dL (ref 30.0–36.0)
MCV: 90.5 fL (ref 78.0–100.0)
Platelets: 392 10*3/uL (ref 150–400)
RBC: 3.91 MIL/uL (ref 3.87–5.11)
RDW: 14.2 % (ref 11.5–15.5)
WBC: 9.5 10*3/uL (ref 4.0–10.5)

## 2016-08-02 LAB — COMPREHENSIVE METABOLIC PANEL
ALBUMIN: 3.2 g/dL — AB (ref 3.5–5.0)
ALT: <5 U/L — ABNORMAL LOW (ref 14–54)
ANION GAP: 9 (ref 5–15)
AST: 21 U/L (ref 15–41)
Alkaline Phosphatase: 130 U/L — ABNORMAL HIGH (ref 38–126)
BILIRUBIN TOTAL: 1.2 mg/dL (ref 0.3–1.2)
BUN: 23 mg/dL — AB (ref 6–20)
CHLORIDE: 104 mmol/L (ref 101–111)
CO2: 23 mmol/L (ref 22–32)
Calcium: 8.1 mg/dL — ABNORMAL LOW (ref 8.9–10.3)
Creatinine, Ser: 0.42 mg/dL — ABNORMAL LOW (ref 0.44–1.00)
GFR calc Af Amer: >60 mL/min (ref >60)
GFR calc non Af Amer: >60 mL/min (ref >60)
GLUCOSE: 81 mg/dL (ref 65–99)
POTASSIUM: 3.4 mmol/L — AB (ref 3.5–5.1)
Sodium: 136 mmol/L (ref 135–145)
TOTAL PROTEIN: 5.8 g/dL — AB (ref 6.5–8.1)

## 2016-08-02 LAB — URINE CULTURE: CULTURE: NO GROWTH

## 2016-08-02 MED ORDER — SODIUM CHLORIDE 0.45 % IV SOLN
INTRAVENOUS | Status: DC
  Administered 2016-08-03 (×2): via INTRAVENOUS

## 2016-08-02 NOTE — Progress Notes (Signed)
Paged MD about patient only voiding x1 during shift. Intake PO and IVF at 75. Morning BP 158/69 while other VSS. Bladder scan completed showed only 43mL. Pt is still disorient x 3 except for self. No orders received

## 2016-08-02 NOTE — Care Management Obs Status (Signed)
MEDICARE OBSERVATION STATUS NOTIFICATION   Patient Details  Name: Patricia Hopkins MRN: [id number] Date of Birth: 06/26/1932   Medicare Observation Status Notification Given:  Yes    Alexis Goodelleele, Verdine Grenfell K, RN 08/02/2016, 4:07 PM

## 2016-08-02 NOTE — Progress Notes (Signed)
PT Cancellation Note  Patient Details Name: Patricia Hopkins MRN: [id number] DOB: 12/06/1931   Cancelled Treatment:     PT order received but eval deferred at request of RN pending new hip Xray.  Will follow.   Avanti Jetter 08/02/2016, 12:17 PM

## 2016-08-02 NOTE — Progress Notes (Signed)
Spoke with patient and daughter at bedside. [facility] for [facility] area [phone] at this time. Have requested [facility] as next choice. Will continue with previously arranged HH.

## 2016-08-02 NOTE — Progress Notes (Signed)
PROGRESS NOTE    Patricia Hopkins  ZOX:[id number] DOB: 07-Feb-1932 DOA: 08/01/2016 PCP: Frederich Chick, MD   Brief Narrative:  Patricia Hopkins is a 83 y.o. female with a history of Parkinson disease, asthma, Bell's palsy, essential hypertension, osteoporosis. She presents with altered mental status with concern for medication effect.   Assessment & Plan:   Active Problems:   Altered mental state   Asthma   Essential hypertension   Osteoporosis   Bell's palsy   Parkinson disease (HCC)   Altered mental status Etiology unclear. Possibly secondary to pain. -treat pain with naproxen and Tylenol -IVF resuscitation -PT eval  Slurred speech Unknown etiology. Has been present for at least three weeks. Neuro exam is non-focal. CT scan unremarkable for acute change. Seems improved from yesterday.  Right hip pain Causing patient significant morbidity as she is not walking anymore (used to walk with a walker). She is now also less autonomous as she used to live on her on at an independent living facility. She may be sensitive to narcotic analgesics causing delirium. -Tylenol 650mg  q6hrs -continue naproxen prn -MRI hip  Parkinson disease Patient has not been taking her medications. Mental status could be sequelae -continue Sinemet -continue Lexapro  Chronic diastolic heart failure Last EF of 60-65% on 11/26/2015 with grade 1 diastolic dysfunction. Hypovolemic on physical exam. -daily weights -hold losartan while dehydrated  Hyponatremia Likely secondary to dehydration. Already given a fluid bolus in the ED. Resolved. -IVF as above  Essential hypertension -hold losartan-hydrochlorothiazide in setting of dehydration  Asthma Stable -continue Atrovent -continue Albuterol  Osteoporosis Not on therapy. Recent fall without fracture.  Bells palsy Chronic right facial droop.   DVT prophylaxis: Lovenox Code Status: Full code Family Communication:  None Disposition Plan: Anticipate discharge in 1 days pending results of MRI and PT evaluation. Delirium still present and concern for hip fracture.   Consultants:   None  Procedures:  None  Antimicrobials:  None    Subjective: Patient is delirious and not coherent, although she does state she has right hip pain.  Objective: Vitals:   08/01/16 1514 08/01/16 1700 08/01/16 2225 08/02/16 0558  BP: 140/73  116/75 (!) 158/69  Pulse: 96  98 79  Resp: 14  16 16   Temp:   98.3 F (36.8 C) 98.2 F (36.8 C)  TempSrc:   Oral Oral  SpO2: 95%  100% 96%  Weight:  45.4 kg (100 lb)    Height:  5' 1.5" (1.562 m)      Intake/Output Summary (Last 24 hours) at 08/02/16 0843 Last data filed at 08/02/16 0200  Gross per 24 hour  Intake              645 ml  Output                0 ml  Net              645 ml   Filed Weights   08/01/16 1238 08/01/16 1700  Weight: 45.4 kg (100 lb) 45.4 kg (100 lb)    Examination:  General exam: Appears calm and comfortable Respiratory system: Clear to auscultation. Respiratory effort normal. Cardiovascular system: S1 & S2 heard, RRR. No murmurs. Gastrointestinal system: Abdomen is nondistended, soft and nontender. Normal bowel sounds heard. Central nervous system: Alert and not oriented to place or time. Mild right lip droop Extremities: No edema. No calf tenderness. Right hip is internally rotated and patient did not tolerate external rotation well Skin: No  cyanosis. No rashes Psychiatry: Judgement and insight appear impaired    Data Reviewed: I have personally reviewed following labs and imaging studies  CBC:  Recent Labs Lab 08/01/16 1300 08/02/16 0428  WBC 12.7* 9.5  NEUTROABS 10.7*  --   HGB 13.4 11.8*  HCT 39.5 35.4*  MCV 89.8 90.5  PLT 476* 392   Basic Metabolic Panel:  Recent Labs Lab 08/01/16 1300 08/02/16 0428  NA 131* 136  K 3.6 3.4*  CL 95* 104  CO2 23 23  GLUCOSE 80 81  BUN 27* 23*  CREATININE 0.52 0.42*   CALCIUM 8.7* 8.1*   GFR: Estimated Creatinine Clearance: 38.9 mL/min (by C-G formula based on SCr of 0.42 mg/dL (L)). Liver Function Tests:  Recent Labs Lab 08/01/16 1300 08/02/16 0428  AST 24 21  ALT 14 <5*  ALKPHOS 151* 130*  BILITOT 1.0 1.2  PROT 6.5 5.8*  ALBUMIN 3.5 3.2*   No results for input(s): LIPASE, AMYLASE in the last 168 hours.  Recent Labs Lab 08/01/16 1309  AMMONIA 22   Coagulation Profile:  Recent Labs Lab 08/01/16 1300  INR 0.98   Cardiac Enzymes: No results for input(s): CKTOTAL, CKMB, CKMBINDEX, TROPONINI in the last 168 hours. BNP (last 3 results) No results for input(s): PROBNP in the last 8760 hours. HbA1C: No results for input(s): HGBA1C in the last 72 hours. CBG: No results for input(s): GLUCAP in the last 168 hours. Lipid Profile: No results for input(s): CHOL, HDL, LDLCALC, TRIG, CHOLHDL, LDLDIRECT in the last 72 hours. Thyroid Function Tests: No results for input(s): TSH, T4TOTAL, FREET4, T3FREE, THYROIDAB in the last 72 hours. Anemia Panel: No results for input(s): VITAMINB12, FOLATE, FERRITIN, TIBC, IRON, RETICCTPCT in the last 72 hours. Sepsis Labs:  Recent Labs Lab 08/01/16 1306  LATICACIDVEN 1.48    Recent Results (from the past 240 hour(s))  Culture, blood (Routine x 2)     Status: None (Preliminary result)   Collection Time: 08/01/16  2:00 PM  Result Value Ref Range Status   Specimen Description   Final    BLOOD RIGHT ARM Performed at [facility], [address] [place], [place] [address]    Special Requests NONE  Final   Culture PENDING  Incomplete   Report Status PENDING  Incomplete         Radiology Studies: Dg Chest 2 View  Result Date: 08/01/2016 CLINICAL DATA:  83 y/o  F; confusion and weakness. EXAM: CHEST  2 VIEW COMPARISON:  07/06/2015 chest radiograph FINDINGS: Stable mild cardiomegaly. Tortuous aorta and aortic atherosclerosis with calcification. Question small hiatal hernia. A chronic  right-sided rib fractures again noted. Deformity of left proximal humerus probably representing sequelae of old fracture. Linear opacity at the left lung base is probably atelectasis. No focal consolidation. No pleural effusion. IMPRESSION: Stable cardiomegaly. Mild aortic atherosclerosis. Question small hiatal hernia. Left basilar atelectasis. Electronically Signed   By: Mitzi HansenLance  Furusawa-Stratton M.D.   On: 08/01/2016 13:58   Ct Head Wo Contrast  Result Date: 08/01/2016 CLINICAL DATA:  Patient with altered level of consciousness 10 days prior. Urinary tract infection. EXAM: CT HEAD WITHOUT CONTRAST TECHNIQUE: Contiguous axial images were obtained from the base of the skull through the vertex without intravenous contrast. COMPARISON:  Brain CT 07/12/2009 FINDINGS: Brain: Ventricles and sulci are prominent compatible with atrophy. Periventricular and subcortical white matter hypodensity compatible with chronic microvascular ischemic changes. No evidence for acute cortically based infarct, intracranial hemorrhage, mass lesion or mass-effect. Vascular: Unremarkable. Skull: Intact.  Sinuses/Orbits: Paranasal sinuses and mastoid air cells are unremarkable. Orbits are unremarkable. Other: None. IMPRESSION: No acute intracranial process. Cortical atrophy and chronic microvascular ischemic changes. Electronically Signed   By: Annia Belt M.D.   On: 08/01/2016 17:07        Scheduled Meds: . carbidopa-levodopa  1 tablet Oral TID  . enoxaparin (LOVENOX) injection  30 mg Subcutaneous Q24H   Continuous Infusions: . sodium chloride 75 mL/hr at 08/02/16 0200     LOS: 0 days     Jacquelin Hawking [facility] 08/02/2016, 8:43 AM Pager: [phone]  If 7PM-7AM, please contact night-coverage [email or web address] Password [facility] 08/02/2016, 8:43 AM

## 2016-08-02 NOTE — Progress Notes (Signed)
Spoke with MRI tech. MRI will see patient in the am. Family made aware.

## 2016-08-02 NOTE — Progress Notes (Signed)
Spoke with Dr Caleb PoppNettey with 2 questions. 1) to discuss DNR with daughter at her request--states he spoke to her yesterday and will place that order. 2) requested prn for agitation  should pt need it for MRI. Dr Caleb PoppNettey states pt did not react well to sedatives historically and declined order. MRI informed

## 2016-08-02 NOTE — Progress Notes (Signed)
   08/02/16 1225  Clinical Encounter Type  Visited With Patient and family together  Visit Type Initial;Spiritual support  Referral From Family  Consult/Referral To Chaplain  Spiritual Encounters  Spiritual Needs Prayer  Stress Factors  Patient Stress Factors Health changes  Family Stress Factors Family relationships  Advance Directives (For Healthcare)  Would patient like information on creating a medical advance directive? Yes (Inpatient - patient requests chaplain consult to create a medical advance directive)  Chaplain visited with patient and family.  After learning who all was in the room, the sister asked for prayer for the patient.  After prayer, the granddaughter asked Chaplain and her mother to step out of the room to discuss Advanced Directive.  Chaplain explained the Advanced Directive - the family expressed that they already had Medical POA papers.  Chaplain shared with family concerning the Medical POA paperwork not being Advanced Directives paperwork and would have nurse give them the paperwork.  Provided prayer and space for granddaughter and daughter to share their feelings about a family member (patient's son) who is challenging for them right now. Chaplain shared with the family that more services are available as they need/desire them so please have nurse let us know.

## 2016-08-02 NOTE — Progress Notes (Signed)
Called to room upon request from family.  Patients daughter (POA), unclear on the plan of care for her mother.  Discussed the plan of care as outlined in the attending physician's note this am.  Patients daughter Patricia Hopkins would like to talk with a physician, as the patient is unable to communicate with a physician. Daughter is aware MD that the will round in AM.  Leticia Coletta RN

## 2016-08-02 NOTE — Progress Notes (Signed)
Chaplain consult re: advance directive / Health Care Power of Attorney   Pt's daughter, Patricia Hopkins, at bedside.    [facility] signed and notarized 02/12/2014.   Paperwork names Patricia Hopkins as Health Care Agent.   Copy of HCPOA in paper chart and copy with medical records to be signed into EPIC.    Daughter is interested in speaking with care team about DNR.   Chaplain reported to RN, who will consult MD.     Patricia Hopkins, Patricia Hopkins MDiv

## 2016-08-03 ENCOUNTER — Observation Stay (HOSPITAL_COMMUNITY): Payer: Medicare Other

## 2016-08-03 ENCOUNTER — Encounter (HOSPITAL_COMMUNITY)
Admission: EM | Disposition: A | Discharge status: Skilled Nursing Facility | Payer: Self-pay | Source: Home / Self Care | Attending: Internal Medicine

## 2016-08-03 ENCOUNTER — Inpatient Hospital Stay (HOSPITAL_COMMUNITY): Payer: Medicare Other

## 2016-08-03 ENCOUNTER — Inpatient Hospital Stay (HOSPITAL_COMMUNITY): Payer: Medicare Other | Admitting: Anesthesiology

## 2016-08-03 ENCOUNTER — Encounter (HOSPITAL_COMMUNITY): Payer: Self-pay | Admitting: Anesthesiology

## 2016-08-03 DIAGNOSIS — R4182 Altered mental status, unspecified: Secondary | ICD-10-CM | POA: Diagnosis present

## 2016-08-03 DIAGNOSIS — N189 Chronic kidney disease, unspecified: Secondary | ICD-10-CM | POA: Diagnosis present

## 2016-08-03 DIAGNOSIS — J45909 Unspecified asthma, uncomplicated: Secondary | ICD-10-CM | POA: Diagnosis not present

## 2016-08-03 DIAGNOSIS — M810 Age-related osteoporosis without current pathological fracture: Secondary | ICD-10-CM | POA: Diagnosis present

## 2016-08-03 DIAGNOSIS — I110 Hypertensive heart disease with heart failure: Secondary | ICD-10-CM | POA: Diagnosis not present

## 2016-08-03 DIAGNOSIS — E860 Dehydration: Secondary | ICD-10-CM | POA: Diagnosis present

## 2016-08-03 DIAGNOSIS — I5032 Chronic diastolic (congestive) heart failure: Secondary | ICD-10-CM | POA: Diagnosis not present

## 2016-08-03 DIAGNOSIS — E440 Moderate protein-calorie malnutrition: Secondary | ICD-10-CM | POA: Diagnosis not present

## 2016-08-03 DIAGNOSIS — S72001A Fracture of unspecified part of neck of right femur, initial encounter for closed fracture: Secondary | ICD-10-CM | POA: Diagnosis present

## 2016-08-03 DIAGNOSIS — D62 Acute posthemorrhagic anemia: Secondary | ICD-10-CM | POA: Diagnosis not present

## 2016-08-03 DIAGNOSIS — Z9049 Acquired absence of other specified parts of digestive tract: Secondary | ICD-10-CM | POA: Diagnosis not present

## 2016-08-03 DIAGNOSIS — J9601 Acute respiratory failure with hypoxia: Secondary | ICD-10-CM | POA: Diagnosis not present

## 2016-08-03 DIAGNOSIS — R410 Disorientation, unspecified: Secondary | ICD-10-CM | POA: Diagnosis not present

## 2016-08-03 DIAGNOSIS — L8992 Pressure ulcer of unspecified site, stage 2: Secondary | ICD-10-CM | POA: Diagnosis present

## 2016-08-03 DIAGNOSIS — M25751 Osteophyte, right hip: Secondary | ICD-10-CM | POA: Diagnosis not present

## 2016-08-03 DIAGNOSIS — R609 Edema, unspecified: Secondary | ICD-10-CM | POA: Diagnosis not present

## 2016-08-03 DIAGNOSIS — S72141S Displaced intertrochanteric fracture of right femur, sequela: Secondary | ICD-10-CM | POA: Diagnosis not present

## 2016-08-03 DIAGNOSIS — K760 Fatty (change of) liver, not elsewhere classified: Secondary | ICD-10-CM | POA: Diagnosis not present

## 2016-08-03 DIAGNOSIS — G20 Parkinson's disease: Secondary | ICD-10-CM | POA: Diagnosis not present

## 2016-08-03 DIAGNOSIS — W19XXXS Unspecified fall, sequela: Secondary | ICD-10-CM | POA: Diagnosis present

## 2016-08-03 DIAGNOSIS — H9190 Unspecified hearing loss, unspecified ear: Secondary | ICD-10-CM | POA: Diagnosis present

## 2016-08-03 DIAGNOSIS — S72141A Displaced intertrochanteric fracture of right femur, initial encounter for closed fracture: Secondary | ICD-10-CM

## 2016-08-03 DIAGNOSIS — K5900 Constipation, unspecified: Secondary | ICD-10-CM | POA: Diagnosis present

## 2016-08-03 DIAGNOSIS — F05 Delirium due to known physiological condition: Secondary | ICD-10-CM | POA: Diagnosis not present

## 2016-08-03 DIAGNOSIS — Z66 Do not resuscitate: Secondary | ICD-10-CM | POA: Diagnosis not present

## 2016-08-03 DIAGNOSIS — E871 Hypo-osmolality and hyponatremia: Secondary | ICD-10-CM | POA: Diagnosis not present

## 2016-08-03 DIAGNOSIS — S72001S Fracture of unspecified part of neck of right femur, sequela: Secondary | ICD-10-CM | POA: Diagnosis not present

## 2016-08-03 DIAGNOSIS — H3530 Unspecified macular degeneration: Secondary | ICD-10-CM | POA: Diagnosis present

## 2016-08-03 DIAGNOSIS — S72101P Unspecified trochanteric fracture of right femur, subsequent encounter for closed fracture with malunion: Secondary | ICD-10-CM | POA: Diagnosis not present

## 2016-08-03 DIAGNOSIS — Z90710 Acquired absence of both cervix and uterus: Secondary | ICD-10-CM | POA: Diagnosis not present

## 2016-08-03 DIAGNOSIS — R4781 Slurred speech: Secondary | ICD-10-CM | POA: Diagnosis present

## 2016-08-03 DIAGNOSIS — I10 Essential (primary) hypertension: Secondary | ICD-10-CM | POA: Diagnosis not present

## 2016-08-03 DIAGNOSIS — G510 Bell's palsy: Secondary | ICD-10-CM | POA: Diagnosis not present

## 2016-08-03 HISTORY — PX: FEMUR IM NAIL: SHX1597

## 2016-08-03 LAB — SURGICAL PCR SCREEN
MRSA, PCR: NEGATIVE
Staphylococcus aureus: NEGATIVE

## 2016-08-03 SURGERY — INSERTION, INTRAMEDULLARY ROD, FEMUR
Anesthesia: Spinal | Site: Hip | Laterality: Right

## 2016-08-03 MED ORDER — LIP MEDEX EX OINT
TOPICAL_OINTMENT | CUTANEOUS | Status: AC
  Filled 2016-08-03: qty 7

## 2016-08-03 MED ORDER — FENTANYL CITRATE (PF) 100 MCG/2ML IJ SOLN
25.0000 ug | INTRAMUSCULAR | Status: DC | PRN
  Administered 2016-08-03: 25 ug via INTRAVENOUS

## 2016-08-03 MED ORDER — PROPOFOL 10 MG/ML IV BOLUS
INTRAVENOUS | Status: AC
  Filled 2016-08-03: qty 40

## 2016-08-03 MED ORDER — ONDANSETRON HCL 4 MG/2ML IJ SOLN
INTRAMUSCULAR | Status: DC | PRN
  Administered 2016-08-03: 4 mg via INTRAVENOUS

## 2016-08-03 MED ORDER — LACTATED RINGERS IV SOLN
INTRAVENOUS | Status: DC | PRN
  Administered 2016-08-03: 18:00:00 via INTRAVENOUS

## 2016-08-03 MED ORDER — PROMETHAZINE HCL 25 MG/ML IJ SOLN: 6.2500 mg | INTRAMUSCULAR | Status: DC | PRN

## 2016-08-03 MED ORDER — FENTANYL CITRATE (PF) 100 MCG/2ML IJ SOLN
INTRAMUSCULAR | Status: AC
  Administered 2016-08-03: 25 ug via INTRAVENOUS
  Filled 2016-08-03: qty 2

## 2016-08-03 MED ORDER — PROPOFOL 10 MG/ML IV BOLUS
INTRAVENOUS | Status: DC | PRN
  Administered 2016-08-03: 20 mg via INTRAVENOUS

## 2016-08-03 MED ORDER — MORPHINE SULFATE (PF) 2 MG/ML IV SOLN
2.0000 mg | INTRAVENOUS | Status: DC | PRN
  Administered 2016-08-03 – 2016-08-04 (×4): 2 mg via INTRAVENOUS
  Filled 2016-08-03 (×4): qty 1

## 2016-08-03 MED ORDER — METOCLOPRAMIDE HCL 5 MG PO TABS
5.0000 mg | ORAL_TABLET | Freq: Three times a day (TID) | ORAL | Status: DC | PRN
  Administered 2016-08-04: 5 mg via ORAL
  Filled 2016-08-03: qty 1

## 2016-08-03 MED ORDER — KETAMINE HCL 50 MG/ML IJ SOLN
INTRAMUSCULAR | Status: DC | PRN
  Administered 2016-08-03: 30 mg via INTRAMUSCULAR

## 2016-08-03 MED ORDER — ACETAMINOPHEN 325 MG PO TABS: 650.0000 mg | ORAL_TABLET | Freq: Four times a day (QID) | ORAL | Status: DC | PRN

## 2016-08-03 MED ORDER — CEFAZOLIN SODIUM-DEXTROSE 2-4 GM/100ML-% IV SOLN: 2.0000 g | INTRAVENOUS | Status: DC

## 2016-08-03 MED ORDER — ONDANSETRON HCL 4 MG/2ML IJ SOLN
4.0000 mg | Freq: Four times a day (QID) | INTRAMUSCULAR | Status: DC | PRN
  Administered 2016-08-10: 4 mg via INTRAVENOUS
  Filled 2016-08-03: qty 2

## 2016-08-03 MED ORDER — PROPOFOL 500 MG/50ML IV EMUL
INTRAVENOUS | Status: DC | PRN
  Administered 2016-08-03: 25 ug/kg/min via INTRAVENOUS

## 2016-08-03 MED ORDER — BUPIVACAINE IN DEXTROSE 0.75-8.25 % IT SOLN
INTRATHECAL | Status: DC | PRN
  Administered 2016-08-03: 1.6 mL via INTRATHECAL

## 2016-08-03 MED ORDER — MEPERIDINE HCL 50 MG/ML IJ SOLN: 6.2500 mg | INTRAMUSCULAR | Status: DC | PRN

## 2016-08-03 MED ORDER — ONDANSETRON HCL 4 MG PO TABS: 4.0000 mg | ORAL_TABLET | Freq: Four times a day (QID) | ORAL | Status: DC | PRN

## 2016-08-03 MED ORDER — CLINDAMYCIN PHOSPHATE 900 MG/50ML IV SOLN
INTRAVENOUS | Status: AC
  Filled 2016-08-03: qty 50

## 2016-08-03 MED ORDER — ACETAMINOPHEN 650 MG RE SUPP: 650.0000 mg | Freq: Four times a day (QID) | RECTAL | Status: DC | PRN

## 2016-08-03 MED ORDER — POVIDONE-IODINE 10 % EX SWAB: 2.0000 "application " | Freq: Once | CUTANEOUS | Status: DC

## 2016-08-03 MED ORDER — METOCLOPRAMIDE HCL 5 MG/ML IJ SOLN: 5.0000 mg | Freq: Three times a day (TID) | INTRAMUSCULAR | Status: DC | PRN

## 2016-08-03 MED ORDER — CLINDAMYCIN PHOSPHATE 600 MG/50ML IV SOLN
600.0000 mg | Freq: Four times a day (QID) | INTRAVENOUS | Status: AC
  Administered 2016-08-04 (×3): 600 mg via INTRAVENOUS
  Filled 2016-08-03 (×3): qty 50

## 2016-08-03 MED ORDER — 0.9 % SODIUM CHLORIDE (POUR BTL) OPTIME
TOPICAL | Status: DC | PRN
  Administered 2016-08-03: 1000 mL

## 2016-08-03 MED ORDER — CLINDAMYCIN PHOSPHATE 900 MG/50ML IV SOLN
INTRAVENOUS | Status: DC | PRN
  Administered 2016-08-03: 900 mg via INTRAVENOUS

## 2016-08-03 MED ORDER — ENOXAPARIN SODIUM 30 MG/0.3ML ~~LOC~~ SOLN
30.0000 mg | SUBCUTANEOUS | Status: DC
  Administered 2016-08-04 – 2016-08-05 (×2): 30 mg via SUBCUTANEOUS
  Filled 2016-08-03 (×2): qty 0.3

## 2016-08-03 MED ORDER — CHLORHEXIDINE GLUCONATE 4 % EX LIQD
60.0000 mL | Freq: Once | CUTANEOUS | Status: DC
  Filled 2016-08-03: qty 60

## 2016-08-03 MED ORDER — LACTATED RINGERS IV SOLN: INTRAVENOUS | Status: DC

## 2016-08-03 SURGICAL SUPPLY — 25 items
BAG ZIPLOCK 12X15 (MISCELLANEOUS) IMPLANT
BNDG GAUZE ELAST 4 BULKY (GAUZE/BANDAGES/DRESSINGS) ×3 IMPLANT
COVER PERINEAL POST (MISCELLANEOUS) ×3 IMPLANT
DRAPE C-ARM 42X120 X-RAY (DRAPES) ×3 IMPLANT
DRAPE INCISE IOBAN 66X45 STRL (DRAPES) ×3 IMPLANT
DRAPE ORTHO SPLIT 77X108 STRL (DRAPES)
DRAPE STERI IOBAN 125X83 (DRAPES) ×3 IMPLANT
DRAPE SURG 17X11 SM STRL (DRAPES) IMPLANT
DRAPE SURG ORHT 6 SPLT 77X108 (DRAPES) IMPLANT
DURAPREP 26ML APPLICATOR (WOUND CARE) ×3 IMPLANT
ELECT REM PT RETURN 9FT ADLT (ELECTROSURGICAL) ×3
ELECTRODE REM PT RTRN 9FT ADLT (ELECTROSURGICAL) ×1 IMPLANT
GAUZE SPONGE 4X4 12PLY STRL (GAUZE/BANDAGES/DRESSINGS) ×3 IMPLANT
GAUZE XEROFORM 1X8 LF (GAUZE/BANDAGES/DRESSINGS) ×3 IMPLANT
GLOVE BIO SURGEON STRL SZ7.5 (GLOVE) ×3 IMPLANT
GLOVE INDICATOR 8.0 STRL GRN (GLOVE) ×3 IMPLANT
GOWN STRL REUS W/TWL LRG LVL3 (GOWN DISPOSABLE) ×3 IMPLANT
KIT BASIN OR (CUSTOM PROCEDURE TRAY) ×3 IMPLANT
MANIFOLD NEPTUNE II (INSTRUMENTS) ×3 IMPLANT
PACK GENERAL/GYN (CUSTOM PROCEDURE TRAY) ×3 IMPLANT
POSITIONER SURGICAL ARM (MISCELLANEOUS) ×3 IMPLANT
STAPLER VISISTAT 35W (STAPLE) ×3 IMPLANT
SUT VIC AB 2-0 CT1 27 (SUTURE) ×2
SUT VIC AB 2-0 CT1 TAPERPNT 27 (SUTURE) ×1 IMPLANT
TOWEL OR 17X26 10 PK STRL BLUE (TOWEL DISPOSABLE) ×3 IMPLANT

## 2016-08-03 NOTE — Progress Notes (Signed)
Received call from MRI tech stating that -since the patient was too agitated to have an MRI yesterday and that since the MD will not prescribe any medications prior if the MRI will be happening today with the patient medicated or if she will be sent to [place] and sedated for the procedure. Passed along in report for day nurse to get clarification for MRI.

## 2016-08-03 NOTE — Transfer of Care (Signed)
Immediate Anesthesia Transfer of Care Note  Patient: Patricia Hopkins  Procedure(s) Performed: Procedure(s): attempted open reduction internal fixation right hip.manipulation right hip under anesthesia (Right)  Patient Location: PACU  Anesthesia Type:Spinal  Level of Consciousness:  sedated, patient cooperative and responds to stimulation  Airway & Oxygen Therapy:Patient Spontanous Breathing and Patient connected to face mask oxgen  Post-op Assessment:  Report given to PACU RN and Post -op Vital signs reviewed and stable  Post vital signs:  Reviewed and stable  Last Vitals:  Vitals:   08/03/16 0534 08/03/16 1400  BP: 131/62 138/77  Pulse: 67 72  Resp: 16 18  Temp: 36.6 C 36.7 C    Complications: No apparent anesthesia complications

## 2016-08-03 NOTE — Anesthesia Postprocedure Evaluation (Addendum)
Anesthesia Post Note  Patient: Patricia NettleCharlotte F Hopkins  Procedure(s) Performed: Procedure(s) (LRB): attempted open reduction internal fixation right hip.manipulation right hip under anesthesia (Right)  Patient location during evaluation: PACU Anesthesia Type: Spinal Level of consciousness: awake and sedated Pain management: pain level controlled Vital Signs Assessment: post-procedure vital signs reviewed and stable Respiratory status: spontaneous breathing Cardiovascular status: stable Postop Assessment: no headache, no backache, spinal receding, patient able to bend at knees and no signs of nausea or vomiting Anesthetic complications: no        Last Vitals:  Vitals:   08/03/16 1400 08/03/16 1850  BP: 138/77 107/65  Pulse: 72 75  Resp: 18 17  Temp: 36.7 C 36.5 C    Last Pain:  Vitals:   08/03/16 1850  TempSrc:   PainSc: 0-No pain   Pain Goal:                 Sanaii Caporaso JR,JOHN Idora Brosious

## 2016-08-03 NOTE — Progress Notes (Addendum)
PROGRESS NOTE    ANALIAH DRUM  ZOX:[id number] DOB: Aug 28, 1931 DOA: 08/01/2016 PCP: Frederich Chick, MD   Brief Narrative:  Patricia Hopkins is a 83 y.o. female with a history of Parkinson disease, asthma, Bell's palsy, essential hypertension, osteoporosis. She presents with altered mental status with concern for medication effect.   Assessment & Plan:   Active Problems:   Altered mental state   Asthma   Essential hypertension   Osteoporosis   Bell's palsy   Parkinson disease (HCC)   Altered mental status Delirium Etiology unclear. Possibly secondary to pain. -treat pain with naproxen and Tylenol. Will add morphine and watch mental status -IVF resuscitation -PT eval  Slurred speech Unknown etiology. Has been present for at least three weeks. Neuro exam is non-focal. CT scan unremarkable for acute change. Seems improved from yesterday.  Right hip pain Causing patient significant morbidity as she is not walking anymore (used to walk with a walker). She is now also less autonomous as she [facility]. She may be sensitive to narcotic analgesics causing delirium. -Tylenol 650mg  q6hrs -continue naproxen prn -Hip x-ray with pelvis since MRI not taking patient secondary to ?agitation.  Parkinson disease Patient has not been taking her medications. Mental status could be sequelae -continue Sinemet -continue Lexapro  Chronic diastolic heart failure Last EF of 60-65% on 11/26/2015 with grade 1 diastolic dysfunction. Hypovolemic on physical exam. -daily weights -hold losartan while dehydrated  Hyponatremia Likely secondary to dehydration. Already given a fluid bolus in the ED. Resolved. -IVF as above  Essential hypertension -hold losartan-hydrochlorothiazide in setting of dehydration  Asthma Stable -continue Atrovent -continue Albuterol  Osteoporosis Not on therapy. Recent fall without fracture.  Bells  palsy Chronic right facial droop.  Constipation -miralax -Dulcolax   UPDATE Right hip x-ray with pelvis appears to show right hip fracture by my read. Will call orthopedic surgery for evaluation and management.   DVT prophylaxis: Lovenox Code Status: Full code Family Communication: Daughter at bedside Disposition Plan: Discharge pending orthopedic evaluation and management.   Consultants:   None  Procedures:  None  Antimicrobials:  None    Subjective: Patient is delirious and not coherent, although she does state she has right hip pain.  Objective: Vitals:   08/02/16 0558 08/02/16 1400 08/02/16 2112 08/03/16 0534  BP: (!) 158/69 123/83 127/65 131/62  Pulse: 79 83 66 67  Resp: 16 18 16 16   Temp: 98.2 F (36.8 C) 97.8 F (36.6 C) 98.3 F (36.8 C) 97.8 F (36.6 C)  TempSrc: Oral Oral Axillary Axillary  SpO2: 96% 100% 97% 100%  Weight:      Height:        Intake/Output Summary (Last 24 hours) at 08/03/16 1038 Last data filed at 08/03/16 0600  Gross per 24 hour  Intake          1133.75 ml  Output                0 ml  Net          1133.75 ml   Filed Weights   08/01/16 1238 08/01/16 1700  Weight: 45.4 kg (100 lb) 45.4 kg (100 lb)    Examination:  General exam: Appears calm and comfortable Respiratory system: Clear to auscultation. Respiratory effort normal. Cardiovascular system: S1 & S2 heard, RRR. No murmurs. Gastrointestinal system: Abdomen is nondistended, soft and nontender. Normal bowel sounds heard. Central nervous system: Alert and not oriented to place  or time. Mild right lip droop Extremities: No edema. No calf tenderness. Leg-length discrepancy of right leg. Pain with internal/external rotation. No inguinal tenderness. Skin: No cyanosis. No rashes Psychiatry: Judgement and insight appear impaired    Data Reviewed: I have personally reviewed following labs and imaging studies  CBC:  Recent Labs Lab 08/01/16 1300 08/02/16 0428  WBC  12.7* 9.5  NEUTROABS 10.7*  --   HGB 13.4 11.8*  HCT 39.5 35.4*  MCV 89.8 90.5  PLT 476* 392   Basic Metabolic Panel:  Recent Labs Lab 08/01/16 1300 08/02/16 0428  NA 131* 136  K 3.6 3.4*  CL 95* 104  CO2 23 23  GLUCOSE 80 81  BUN 27* 23*  CREATININE 0.52 0.42*  CALCIUM 8.7* 8.1*   GFR: Estimated Creatinine Clearance: 38.9 mL/min (by C-G formula based on SCr of 0.42 mg/dL (L)). Liver Function Tests:  Recent Labs Lab 08/01/16 1300 08/02/16 0428  AST 24 21  ALT 14 <5*  ALKPHOS 151* 130*  BILITOT 1.0 1.2  PROT 6.5 5.8*  ALBUMIN 3.5 3.2*   No results for input(s): LIPASE, AMYLASE in the last 168 hours.  Recent Labs Lab 08/01/16 1309  AMMONIA 22   Coagulation Profile:  Recent Labs Lab 08/01/16 1300  INR 0.98   Cardiac Enzymes: No results for input(s): CKTOTAL, CKMB, CKMBINDEX, TROPONINI in the last 168 hours. BNP (last 3 results) No results for input(s): PROBNP in the last 8760 hours. HbA1C: No results for input(s): HGBA1C in the last 72 hours. CBG: No results for input(s): GLUCAP in the last 168 hours. Lipid Profile: No results for input(s): CHOL, HDL, LDLCALC, TRIG, CHOLHDL, LDLDIRECT in the last 72 hours. Thyroid Function Tests: No results for input(s): TSH, T4TOTAL, FREET4, T3FREE, THYROIDAB in the last 72 hours. Anemia Panel: No results for input(s): VITAMINB12, FOLATE, FERRITIN, TIBC, IRON, RETICCTPCT in the last 72 hours. Sepsis Labs:  Recent Labs Lab 08/01/16 1306  LATICACIDVEN 1.48    Recent Results (from the past 240 hour(s))  Culture, blood (Routine x 2)     Status: None (Preliminary result)   Collection Time: 08/01/16 12:56 PM  Result Value Ref Range Status   Specimen Description BLOOD LEFT ANTECUBITAL  Final   Special Requests BOTTLES DRAWN AEROBIC AND ANAEROBIC 5CC EACH  Final   Culture   Final    NO GROWTH < 24 HOURS Performed at [facility], [address], [place], [place] [address]    Report Status PENDING   Incomplete  Urine culture     Status: None   Collection Time: 08/01/16  1:00 PM  Result Value Ref Range Status   Specimen Description URINE, CLEAN CATCH  Final   Special Requests NONE  Final   Culture   Final    NO GROWTH Performed at [facility], [address] [place], [place] [address]    Report Status 08/02/2016 FINAL  Final  Culture, blood (Routine x 2)     Status: None (Preliminary result)   Collection Time: 08/01/16  2:00 PM  Result Value Ref Range Status   Specimen Description BLOOD RIGHT ARM  Final   Special Requests NONE  Final   Culture   Final    NO GROWTH < 24 HOURS Performed at [facility], [address] [place], [place] [address]    Report Status PENDING  Incomplete         Radiology Studies: Dg Chest 2 View  Result Date: 08/01/2016 CLINICAL DATA:  83 y/o  F; confusion and weakness. EXAM: CHEST  2 VIEW COMPARISON:  07/06/2015 chest radiograph FINDINGS: Stable mild cardiomegaly. Tortuous aorta and aortic atherosclerosis with calcification. Question small hiatal hernia. A chronic right-sided rib fractures again noted. Deformity of left proximal humerus probably representing sequelae of old fracture. Linear opacity at the left lung base is probably atelectasis. No focal consolidation. No pleural effusion. IMPRESSION: Stable cardiomegaly. Mild aortic atherosclerosis. Question small hiatal hernia. Left basilar atelectasis. Electronically Signed   By: Mitzi HansenLance  Furusawa-Stratton M.D.   On: 08/01/2016 13:58   Ct Head Wo Contrast  Result Date: 08/01/2016 CLINICAL DATA:  Patient with altered level of consciousness 10 days prior. Urinary tract infection. EXAM: CT HEAD WITHOUT CONTRAST TECHNIQUE: Contiguous axial images were obtained from the base of the skull through the vertex without intravenous contrast. COMPARISON:  Brain CT 07/12/2009 FINDINGS: Brain: Ventricles and sulci are prominent compatible with atrophy. Periventricular and subcortical white matter  hypodensity compatible with chronic microvascular ischemic changes. No evidence for acute cortically based infarct, intracranial hemorrhage, mass lesion or mass-effect. Vascular: Unremarkable. Skull: Intact. Sinuses/Orbits: Paranasal sinuses and mastoid air cells are unremarkable. Orbits are unremarkable. Other: None. IMPRESSION: No acute intracranial process. Cortical atrophy and chronic microvascular ischemic changes. Electronically Signed   By: Annia Beltrew  Davis M.D.   On: 08/01/2016 17:07   Dg Hip Unilat With Pelvis 2-3 Views Right  Result Date: 08/03/2016 CLINICAL DATA:  Diffuse pain.  Fall . EXAM: DG HIP (WITH OR WITHOUT PELVIS) 2-3V RIGHT COMPARISON:  07/16/2016.  CT 07/16/2016.  Exam. FINDINGS: Angulated comminuted fracture of the right hip is noted. Deformity noted the left pubis consistent with old injury. Similar findings noted on prior exam. Diffuse osteopenia degenerative change. Peripheral vascular calcification IMPRESSION: 1. Acute right intertrochanteric hip fracture with angulation deformity. 2.  Old fractures left pubis. 3. Peripheral vascular disease . Electronically Signed   By: Maisie Fushomas  Register   On: 08/03/2016 10:35        Scheduled Meds: . carbidopa-levodopa  1 tablet Oral TID  . enoxaparin (LOVENOX) injection  30 mg Subcutaneous Q24H   Continuous Infusions: . sodium chloride 75 mL/hr at 08/03/16 0600     LOS: 0 days     Jacquelin Hawkingalph Lexus Shampine [facility] 08/03/2016, 10:38 AM Pager: [phone]  If 7PM-7AM, please contact night-coverage [email or web address] Password [email or web address] 08/03/2016, 10:38 AM

## 2016-08-03 NOTE — Anesthesia Preprocedure Evaluation (Addendum)
Anesthesia Evaluation  Patient identified by MRN, date of birth, ID band Patient awake    Reviewed: Allergy & Precautions, NPO status , Patient's Chart, lab work & pertinent test results  Airway Mallampati: II       Dental  (+) Poor Dentition   Pulmonary former smoker,    Pulmonary exam normal        Cardiovascular hypertension, Pt. on medications Normal cardiovascular exam     Neuro/Psych negative psych ROS   GI/Hepatic negative GI ROS, Neg liver ROS,   Endo/Other    Renal/GU   negative genitourinary   Musculoskeletal   Abdominal Normal abdominal exam  (+)   Peds negative pediatric ROS (+)  Hematology negative hematology ROS (+)   Anesthesia Other Findings Ordering physician: Pricilla Riffle, MD Study date: 12/11/15 Patient Information   Name MRN Description Patricia Hopkins [id number] 83 y.o. Female Result Notes   Notes Recorded by Henrietta Dine, RN on 12/11/2015 at 4:06 PM Informed patient's DPR of results and verbal understanding expressed. ------  Notes Recorded by Henrietta Dine, RN on 12/11/2015 at 3:06 PM Left message to call back. ------  Notes Recorded by Pricilla Riffle, MD on 12/11/2015 at 2:13 PM Stress test is normal Symptoms do not appear to be due to a blood flow problem to heart   Vitals   Height Weight BMI (Calculated) 5\' 1"  (1.549 m) 112 lb (50.8 kg) 21.2 Study Highlights    The left ventricular ejection fraction is hyperdynamic (>65%).  Nuclear stress EF: 81%.  There was no ST segment deviation noted during stress.  The study is normal.  This is a low risk study.   Nuclear History and Indications   History and Indications Indication for Stress Test: Diagnosis of coronary disease History: No Cardiac History Cardiac Risk Factors: Family History - CAD  Symptoms: Fatigue and Palpitations  Stress Findings   ECG Baseline ECG exhibits normal sinus rhythm and with PVCs..  Stress  Findings A pharmacological stress test was performed using IV Lexiscan 0.4mg  over 10 seconds performed without concurrent submaximal exercise.  The patient reported nausea, shortness of breath and headache during the stress test.   Test was stopped per protocol.   Recovery time: 5 minutes.  Response to Stress There was no ST segment deviation noted during stress.  Arrhythmias during stress: none.  Arrhythmias during recovery: none.  There were no significant arrhythmias noted during the test.  ECG was interpretable and non-conclusive.  Stress Measurements   Baseline Vitals Rest HR  72 bpm  Rest BP  136/81 mmHg  Peak Stress Vitals Peak HR  100 bpm  Peak BP  155/87 mmHg    Nuclear Stress Measurements   LV sys vol  5 mL  TID  1.14   LV dias vol  28 mL  LHR  0.28   SSS  11   SRS  6   SDS  5       Nuclear Stress Findings   Isotope administration Rest isotope was administered with an IV injection of 10.2 mCi technetium tetrofosmin. Rest SPECT images were obtained approximately 45 minutes post tracer injection. Stress isotope was administered with an IV injection of 32.7 mCi technetium tetrofosmin 20 seconds post IV Lexiscan administration. Stress SPECT images were obtained approximately 60 minutes post tracer injection.  Nuclear Study Quality Overall image quality is good.Breast attenuation artifact was present. Image quality affected due to significant extracardiac activity.  Nuclear Measurements Study was gated.  Rest Perfusion  Rest perfusion normal.  Stress Perfusion Stress perfusion normal.  Overall Study Impression Myocardial perfusion is normal. The study is normal. This is a low risk study. Overall left ventricular systolic function was normal. LV cavity size is normal. Nuclear stress EF: 81%. The left ventricular ejection fraction is hyperdynamic (>65%). There is no prior study for comparison.  From: ACCF/SCAI/STS/AATS/AHA/ASNC/HFSA/SCCT 2012  Appropriate Use Criteria for Coronary Revascularization Focused Update  Signed   Electronically signed by Quintella Reichert, MD on 12/11/15 at 1406 EDT Report approved and finalized on 12/11/2015 1406 Imaging   Imaging Information Order-Level Documents - 12/11/2015:   Scan on 12/11/2015 1:01 PM by Hurshel Party : Protocol and Data sheetsScan on 12/11/2015 1:01 PM by Hurshel Party : Protocol and Data sheets  Scan on 12/11/2015 1:01 PM by Hurshel Party : Consent FormScan on 12/11/2015 1:01 PM by Hurshel Party : Consent Form  Scan on 12/11/2015 1:02 PM by Hurshel Party : MPI ImageScan on 12/11/2015 1:02 PM by Hurshel Party : MPI Image  Scan on 12/11/2015 11:33 AM by Provider Default, MDScan on 12/11/2015 11:33 AM by Provider Default, MD    Encounter-Level Documents - 12/11/2015:   Electronic signature on 12/11/2015 9:43 AM  Electronic signature on 12/11/2015 9:42 AM    Exam Information   Status Exam Begun  Exam Ended  Final (99) 12/11/2015 9:44 AM 12/11/2015 12:23 PM External Result Report   External Result Report Encounter Report   Patient Encounter Report    Reproductive/Obstetrics negative OB ROS                           Anesthesia Physical Anesthesia Plan  ASA: III  Anesthesia Plan: Spinal   Post-op Pain Management:    Induction:   Airway Management Planned:   Additional Equipment:   Intra-op Plan:   Post-operative Plan:   Informed Consent: I have reviewed the patients History and Physical, chart, labs and discussed the procedure including the risks, benefits and alternatives for the proposed anesthesia with the patient or authorized representative who has indicated his/her understanding and acceptance.     Plan Discussed with: CRNA and Surgeon  Anesthesia Plan Comments: ([facility]  ECHO COMPLETE WO IMAGE ENHANCING AGENT  Order# [id number]  Reading physician: Chilton Si, MD Ordering physician: Pricilla Riffle, MD Study date:  11/26/15 Result Notes   Notes Recorded by Lendon Ka, RN on 12/03/2015 at 10:03 AM Spoke with patient's daughter, Dorcas Carrow. Informed of echo results and that Dr. Tenny Craw has recommended stress test. Reviewed instructions for test and that someone will call her to schedule. She verbalizes understanding and agreement. ------  Notes Recorded by Pricilla Riffle, MD on 11/26/2015 at 10:47 PM Echo shows pumping function of the heart is normal There is very mild relaxation abnormaltiy I would recomm pt be set up for Lexiscan myovue to evaluate for ischemia   Study Result   Result status: Final result                          [facility]                        [address]                        [place], [place] [address]  [phone]  ------------------------------------------------------------------- Transthoracic Echocardiography  Patient:    Patricia Hopkins, Patricia Hopkins MR #:       [id number] Study Date: 11/26/2015 Gender:     F Age:        17 Height:     152.4 cm Weight:     52 kg BSA:        1.49 m^2 Pt. Status: Room:   ATTENDING    Dietrich Pates, M.D.  ORDERING     Dietrich Pates, M.D.  REFERRING    Dietrich Pates, M.D.  SONOGRAPHER  Aida Raider, RDCS  PERFORMING   [facility]  cc:  ------------------------------------------------------------------- LV EF: 60% -   65%  ------------------------------------------------------------------- Indications:      R07.9 Chest Pain.  ------------------------------------------------------------------- History:   PMH:  Acquired from the patient and from the patient&'s chart.  PMH:  Chronic Kidney Disease.  Risk factors:  Hypertension.   ------------------------------------------------------------------- Study Conclusions  - Left ventricle: The cavity size was normal. There was mild focal   basal hypertrophy of the septum. Systolic function was normal.   The estimated ejection  fraction was in the range of 60% to 65%.   Wall motion was normal; there were no regional wall motion   abnormalities. Doppler parameters are consistent with abnormal   left ventricular relaxation (grade 1 diastolic dysfunction).   Doppler parameters are consistent with high ventricular filling   pressure. - Aortic valve: Transvalvular velocity was within the normal range.   There was no stenosis. There was no regurgitation. - Mitral valve: Transvalvular velocity was within the normal range.   There was no evidence for stenosis. There was trivial   regurgitation. - Right ventricle: The cavity size was normal. Wall thickness was   normal. Systolic function was normal. - Atrial septum: No defect or patent foramen ovale was identified   by color flow Doppler. - Tricuspid valve: There was trivial regurgitation. - Pulmonary arteries: Systolic pressure was within the normal   range.  Transthoracic echocardiography.  M-mode, complete 2D, spectral Doppler, and color Doppler.  Birthdate:  Patient birthdate: 02/19/1932.  Age:  Patient is 83 yr old.  Sex:  Gender: female. BMI: 22.4 kg/m^2.  Blood pressure:     137/90  Patient status: Outpatient.  Study date:  Study date: 11/26/2015. Study time: 10:48 AM.  Location:   [facility]  -------------------------------------------------------------------  ------------------------------------------------------------------- Left ventricle:  The cavity size was normal. There was mild focal basal hypertrophy of the septum. Systolic function was normal. The estimated ejection fraction was in the range of 60% to 65%. Wall motion was normal; there were no regional wall motion abnormalities. Doppler parameters are consistent with abnormal left ventricular relaxation (grade 1 diastolic dysfunction). Doppler parameters are consistent with high ventricular filling pressure.   ------------------------------------------------------------------- Aortic  valve:   Trileaflet; normal thickness leaflets. Mobility was not restricted.  Doppler:  Transvalvular velocity was within the normal range. There was no stenosis. There was no regurgitation.   ------------------------------------------------------------------- Aorta:  Aortic root: The aortic root was normal in size.  ------------------------------------------------------------------- Mitral valve:   Structurally normal valve.   Mobility was not restricted.  Doppler:  Transvalvular velocity was within the normal range. There was no evidence for stenosis. There was trivial regurgitation.  ------------------------------------------------------------------- Left atrium:  The atrium was normal in size.  ------------------------------------------------------------------- Atrial septum:  No defect or patent foramen ovale was identified by color flow Doppler.  -------------------------------------------------------------------  ECHO COMPLETE WO IMAGE ENHANCING AGENT  Order# [id number]  Reading physician: Elmarie Shiley  Duke Salvia, MD Ordering physician: Pricilla Riffle, MD Study date: 11/26/15 Result Notes   Notes Recorded by Lendon Ka, RN on 12/03/2015 at 10:03 AM Spoke with patient's daughter, Dorcas Carrow. Informed of echo results and that Dr. Tenny Craw has recommended stress test. Reviewed instructions for test and that someone will call her to schedule. She verbalizes understanding and agreement. ------  Notes Recorded by Pricilla Riffle, MD on 11/26/2015 at 10:47 PM Echo shows pumping function of the heart is normal There is very mild relaxation abnormaltiy I would recomm pt be set up for Lexiscan myovue to evaluate for ischemia   Study Result   Result status: Final result                          [facility]                        [address]                        [place], [place] [address]                             [phone]  ------------------------------------------------------------------- Transthoracic Echocardiography  Patient:    Patricia Hopkins, Patricia Hopkins MR #:       [id number] Study Date: 11/26/2015 Gender:     F Age:        60 Height:     152.4 cm Weight:     52 kg BSA:        1.49 m^2 Pt. Status: Room:   ATTENDING    Dietrich Pates, M.D.  ORDERING     Dietrich Pates, M.D.  REFERRING    Dietrich Pates, M.D.  SONOGRAPHER  Aida Raider, RDCS  PERFORMING   [facility]  cc:  ------------------------------------------------------------------- LV EF: 60% -   65%  ------------------------------------------------------------------- Indications:      R07.9 Chest Pain.  ------------------------------------------------------------------- History:   PMH:  Acquired from the patient and from the patient&'s chart.  PMH:  Chronic Kidney Disease.  Risk factors:  Hypertension.   ------------------------------------------------------------------- Study Conclusions  - Left ventricle: The cavity size was normal. There was mild focal   basal hypertrophy of the septum. Systolic function was normal.   The estimated ejection fraction was in the range of 60% to 65%.   Wall motion was normal; there were no regional wall motion   abnormalities. Doppler parameters are consistent with abnormal   left ventricular relaxation (grade 1 diastolic dysfunction).   Doppler parameters are consistent with high ventricular filling   pressure. - Aortic valve: Transvalvular velocity was within the normal range.   There was no stenosis. There was no regurgitation. - Mitral valve: Transvalvular velocity was within the normal range.   There was no evidence for stenosis. There was trivial   regurgitation. - Right ventricle: The cavity size was normal. Wall thickness was   normal. Systolic function was normal. - Atrial septum: No defect or patent foramen ovale was identified   by color flow Doppler. - Tricuspid  valve: There was trivial regurgitation. - Pulmonary arteries: Systolic pressure was within the normal   range.  Transthoracic echocardiography.  M-mode, complete 2D, spectral Doppler, and color Doppler.  Birthdate:  Patient birthdate: [birth year].  Age:  Patient is 83 yr old.  Sex:  Gender: female. BMI: 22.4 kg/m^2.  Blood pressure:  137/90  Patient status: Outpatient.  Study date:  Study date: 11/26/2015. Study time: 10:48 AM.  Location:  [facility]  -------------------------------------------------------------------  ------------------------------------------------------------------- Left ventricle:  The cavity size was normal. There was mild focal basal hypertrophy of the septum. Systolic function was normal. The estimated ejection fraction was in the range of 60% to 65%. Wall motion was normal; there were no regional wall motion abnormalities. Doppler parameters are consistent with abnormal left ventricular relaxation (grade 1 diastolic dysfunction). Doppler parameters are consistent with high ventricular filling pressure.   ------------------------------------------------------------------- Aortic valve:   Trileaflet; normal thickness leaflets. Mobility was not restricted.  Doppler:  Transvalvular velocity was within the normal range. There was no stenosis. There was no regurgitation.   ------------------------------------------------------------------- Aorta:  Aortic root: The aortic root was normal in size.  ------------------------------------------------------------------- Mitral valve:   Structurally normal valve.   Mobility was not restricted.  Doppler:  Transvalvular velocity was within the normal range. There was no evidence for stenosis. There was trivial regurgitation.  ------------------------------------------------------------------- Left atrium:  The atrium was normal in  size.  ------------------------------------------------------------------- Atrial septum:  No defect or patent foramen ovale was identified by color flow Doppler.  ------------------------------------------------------------------- Right ventricle:  The cavity size was normal. Wall thickness was normal. Systolic function was normal.  ------------------------------------------------------------------- Pulmonic valve:    Structurally normal valve.   Cusp separation was normal.  Doppler:  Transvalvular velocity was within the normal range. There was no evidence for stenosis. There was no regurgitation.  ------------------------------------------------------------------- Tricuspid valve:   Structurally normal valve.    Doppler: Transvalvular velocity was within the normal range. There was trivial regurgitation.  ------------------------------------------------------------------- Pulmonary artery:   The main pulmonary artery was normal-sized. Systolic pressure was within the normal range.  ------------------------------------------------------------------- Right atrium:  The atrium was normal in size.  ------------------------------------------------------------------- Pericardium:  There was no pericardial effusion.  ------------------------------------------------------------------- Systemic veins: Inferior vena cava: The vessel was normal in size. The respirophasic diameter changes were in the normal range (>= 50%), consistent with normal central venous pressure.  ------------------------------------------------------------------- Measurements   Left ventricle                         Value        Reference  LV ID, ED, PLAX chordal        (L)     37.7  mm     43 - 52  LV ID, ES, PLAX chordal        (L)     22.4  mm     23 - 38  LV fx shortening, PLAX chordal         41    %      >=29  LV PW thickness, ED                    8.47  mm     ---------  IVS/LV PW ratio,  ED            (H)     1.49         <=1.3  Stroke volume, 2D                      58    ml     ---------  Stroke volume/bsa, 2D                  39    ml/m^2 ---------  LV  e&', lateral                         4.03  cm/s   ---------  LV E/e&', lateral                       17.47        ---------  LV e&', medial                          3.37  cm/s   ---------  LV E/e&', medial                        20.89        ---------  LV e&', average                         3.7   cm/s   ---------  LV E/e&', average                       19.03        ---------    Ventricular septum                     Value        Reference  IVS thickness, ED                      12.6  mm     ---------    LVOT                                   Value        Reference  LVOT ID, S                             19    mm     ---------  LVOT area                              2.84  cm^2   ---------  LVOT ID                                19    mm     ---------  LVOT peak velocity, S                  99.4  cm/s   ---------  LVOT mean velocity, S                  65.6  cm/s   ---------  LVOT VTI, S                            20.3  cm     ---------  Stroke volume (SV), LVOT DP            57.6  ml     ---------  Stroke index (SV/bsa), LVOT DP         38.6  ml/m^2 ---------    Aorta  Value        Reference  Aortic root ID, ED                     29    mm     ---------    Left atrium                            Value        Reference  LA ID, A-P, ES                         41    mm     ---------  LA ID/bsa, A-P                 (H)     2.75  cm/m^2 <=2.2  LA volume, S                           28.8  ml     ---------  LA volume/bsa, S                       19.3  ml/m^2 ---------  LA volume, ES, 1-p A4C                 32.7  ml     ---------  LA volume/bsa, ES, 1-p A4C             22    ml/m^2 ---------  LA volume, ES, 1-p A2C                 24.6  ml     ---------  LA volume/bsa, ES, 1-p A2C              16.5  ml/m^2 ---------    Mitral valve                           Value        Reference  Mitral E-wave peak velocity            70.4  cm/s   ---------  Mitral A-wave peak velocity            135   cm/s   ---------  Mitral deceleration time       (H)     356   ms     150 - 230  Mitral E/A ratio, peak                 0.5          ---------    Pulmonary arteries                     Value        Reference  PA pressure, S, DP                     21    mm Hg  <=30    Tricuspid valve                        Value        Reference  Tricuspid regurg peak velocity         211   cm/s   ---------  Tricuspid peak RV-RA gradient  18    mm Hg  ---------    Systemic veins                         Value        Reference  Estimated CVP                          3     mm Hg  ---------    Right ventricle                        Value        Reference  TAPSE                                  21.1  mm     ---------  RV pressure, S, DP                     21    mm Hg  <=30  RV s&', lateral, S                      17.65 cm/s   ---------  Legend: (L)  and  (H)  mark values outside specified reference range.  ------------------------------------------------------------------- Prepared and Electronically Authenticated by  Chilton Si, MD 2017-05-24T13:29:06 PACS Images   Show images for ECHOCARDIOGRAM COMPLETE Patient Information   Patient Name Patricia Hopkins, Patricia Hopkins Sex Female DOB [birth year] SSN [id number] Reason For Exam  Priority: Routine  Chest Pain 786.50 / R07.9 Dx: Other fatigue (R53.83 (ICD-10-CM)); Chest pain, unspecified chest pain type (R07.9 (ICD-10-CM)) Surgical History   Surgical History    No past medical history on file.  Other Surgical History    Procedure Laterality Date Comment Source ABDOMINAL HYSTERECTOMY    Provider APPENDECTOMY    Provider CATARACT EXTRACTION Right   Provider CHOLECYSTECTOMY    Provider EYE SURGERY Left   Provider HERNIA  REPAIR   ventral Provider ROTATOR CUFF REPAIR Left   Provider  Performing Technologist/Nurse   Performing Technologist/Nurse: Natashia Rodgers-Jones          Implants     No active implants to display in this view. Order-Level Documents:   There are no order-level documents.  Encounter-Level Documents - 11/26/2015:   Electronic signature on 11/26/2015 10:08 AM  Electronic signature on 11/26/2015 10:08 AM    Signed   Electronically signed by Chilton Si, MD on 11/26/15 at 1329 EDT Printable Result Report   Result Report  )        Anesthesia Quick Evaluation

## 2016-08-03 NOTE — Progress Notes (Signed)
Initial Nutrition Assessment  INTERVENTION:   Diet advancement per MD RD will continue to monitor for plan  NUTRITION DIAGNOSIS:   Increased nutrient needs related to other (see comment) (hip fracture) as evidenced by estimated needs.  GOAL:   Patient will meet greater than or equal to 90% of their needs  MONITOR:   Diet advancement, Labs, Weight trends, I & O's  REASON FOR ASSESSMENT:   Low Braden    ASSESSMENT:   83 y.o. female with a history of Parkinson disease, asthma, Bell's palsy, essential hypertension, osteoporosis. She presents with altered mental status with concern for medication effect.  Patient in room with staff and family at time of visit. Spoke with RN outside of room, states pt is now NPO. Plan is uncertain at this time. Pt with hip fracture per MD note.  Pt would likely benefit from nutritional supplementation once diet is advanced. Will address supplement needs at follow-up.  Per chart review, pt has lost 10 lb since July 2017 (9% wt loss x 6 months, insignificant for time frame).   Medications reviewed. Labs reviewed: Low K  Diet Order:  Diet NPO time specified Diet NPO time specified  Skin:  Reviewed, no issues  Last BM:  PTA  Height:   Ht Readings from Last 1 Encounters:  08/01/16 5' 1.5" (1.562 m)    Weight:   Wt Readings from Last 1 Encounters:  08/01/16 100 lb (45.4 kg)    Ideal Body Weight:  50 kg  BMI:  Body mass index is 18.59 kg/m.  Estimated Nutritional Needs:   Kcal:  1200-1400  Protein:  60-70g  Fluid:  1.4L/day  EDUCATION NEEDS:   No education needs identified at this time  Tilda FrancoLindsey Ashtin Rosner, MS, RD, LDN Pager: [phone] After Hours Pager: [phone]

## 2016-08-03 NOTE — Op Note (Signed)
Date of Surgery: 08/03/2016  INDICATIONS: Patricia Hopkins is a 83 y.o.-year-old female with a right intertrochanteric hip fracture;  Patricia Hopkins is a lady with fairly involved a Parkinson's disease who was admitted to the hospital this past Sunday for altered mental status. Per her daughter she had had some significant complaints of right hip pain for about 3 weeks now. She is seen in the emergency department and evaluated with a CT scan that was read as negative. However she has been non-weightbearing since that time and unable to mobilize for the last 3 weeks. X-rays were obtained in this hospital admission which did demonstrate an packet intertrochanteric fracture of the right side. Plan was for intramedullary nailing and stabilization of the fracture. The patient and family did consent to the procedure after discussion of the risks and benefits.    PREOPERATIVE DIAGNOSIS:  1.  Right hip intertrochanteric hip fracture, subacute  POSTOPERATIVE DIAGNOSIS: Same.  PROCEDURE:  1.  Closed reduction of right hip with anesthesia 2.  Attempted open reduction of right hip fracture  SURGEON: Patricia Kida, M.D.  ASSIST: none.  ANESTHESIA:  spinal  IV FLUIDS AND URINE: See anesthesia.  ESTIMATED BLOOD LOSS: 30 mL.  IMPLANTS: none  DRAINS: none  COMPLICATIONS: None.  DESCRIPTION OF PROCEDURE: The patient was brought to the operating room and placed supine on the Hana table.  The patient had been signed prior to the procedure and this was documented. The patient had the anesthesia placed by the anesthesiologist.  A time-out was performed to confirm that this was the correct patient, site, side and location. The patient did receive antibiotics prior to the incision and was re-dosed during the procedure as needed at indicated intervals.    Prior to prepping and draping of the extremity after the patient was placed on the Hana table traction and internal rotation and adduction was applied  to the operative extremity. Of note there was no mobilization through the fracture and reduction maneuver under anesthesia was not successful. At this point in time a decision to prep and drape the extremity and attempted open reduction of the fracture to prepare for nail insertion.  The patient had the operative extremity prepped and draped in the standard surgical fashion.  A lateral incision was made at the level of the fracture based on fluoroscopic imaging. Dissection was carried down skin and subcutaneous fat and IT fascia down to the level of the fracture.  Of note there was noted to be fairly abundant callus formation along the fracture itself. Also the lateral greater trochanter was very comminuted. Utilizing a Cobb the callus was debrided and the fracture was noted to be more mobile. A bone was applied to the shaft component of the fracture. I used the bone out to translate the shaft piece laterally and a Cobb was inserted into the head neck component to mobilize it medially to attempt to reduce the calcar. With maximal effort reduction was not able to be achieved in this manner. There was better alignment on the lateral view however on the AP view calcar was still about a centimeter off on the reduction. It was apparent that a malunion had set up in the intervening 3 weeks between injury and surgery. At this point in time the decision was made to abort the attempted intramedullary nailing secondary to potential to do more harm than good.  The incision was copiously irrigated IT band was closed with 0 Vicryl. Subcutaneous cutaneous tissue closed with 2-0 Vicryl. Staples  were used to close the skin. Sterile dressing was applied.  The patient tolerated the procedure well without any immediate complications. All counts were correct. The patient was taken to PACU in stable condition.  POSTOPERATIVE PLAN: Patricia Hopkins will continue to be nonweightbearing on the right lower extremity. I would like  for her to be on DVT prophylaxis with Lovenox or subcutaneous heparin 3 times a day.  I'm going to consult with my partners about the potential for a proximal femur replacement or other arthroplasty options for this fracture. In the interim I hope to have an answer in the next 24 hours. I have updated the family and I'll update the primary team when new information is available.  Patricia Hopkins

## 2016-08-03 NOTE — Consult Note (Signed)
ORTHOPAEDIC CONSULTATION  REQUESTING PHYSICIAN: Narda Bondsalph A Nettey, MD  PCP:  Frederich ChickWEBB, CAROL D, MD  Chief Complaint: Right hip fracture  HPI: Patricia Hopkins is a 83 y.o. female who complains of right hip pain for some time now.  She did have some hip pain in the right side about 3 weeks ago without any radiographic signs on xray or CT scan of fracture.  She has been bed bound since that time and was found during her hospital admission, for altered mental status, to have a right intertrochanteric hip fracture.    Currently unable to bear weight.  She is a poor historian with AMS and hx of Parkinson's and the majority of this HPI is obtained through communication with her daughter.  Past Medical History:  Diagnosis Date  . Arthritis   . Asthma   . Bell's palsy 1959   R side  . Chronic kidney disease   . Fatty liver   . Hypertension   . Macular degeneration   . Osteoporosis   . Parkinson disease [facility](HCC)    Past Surgical History:  Procedure Laterality Date  . ABDOMINAL HYSTERECTOMY    . APPENDECTOMY    . CATARACT EXTRACTION Right   . CHOLECYSTECTOMY    . EYE SURGERY Left   . HERNIA REPAIR     ventral  . ROTATOR CUFF REPAIR Left    Social History   Social History  . Marital status: Widowed    Spouse name: N/A  . Number of children: N/A  . Years of education: N/A   Social History Main Topics  . Smoking status: Former Smoker    Quit date: 07/05/1976  . Smokeless tobacco: Former NeurosurgeonUser    Quit date: 08/04/1976  . Alcohol use No  . Drug use: No  . Sexual activity: No   Other Topics Concern  . None   Social History Narrative  . None   Family History  Problem Relation Age of Onset  . Parkinson's disease Daughter    Allergies  Allergen Reactions  . Cepacol [Cetylpyridinium] Other (See Comments)    unknown  . Crestor [Rosuvastatin] Other (See Comments)    No energy  . Dual Action Complete [Famotidine-Ca Carb-Mag Hydrox] Other (See Comments)    unknown  . Fosamax  [Alendronate Sodium]     Nausea     Has taken Reclast  One other time did not have any problems  . Penicillins Other (See Comments)    Has patient had a PCN reaction causing immediate rash, facial/tongue/throat swelling, SOB or lightheadedness with hypotension: unknown Has patient had a PCN reaction causing severe rash involving mucus membranes or skin necrosis:unknown Has patient had a PCN reaction that required hospitalization : unknown Has patient had a PCN reaction occurring within the last 10 years: no If all of the above answers are "NO", then may proceed with Cephalosporin use.   Earnstine Regal. Pravachol [Pravastatin Sodium] Other (See Comments)  . Valium [Diazepam] Other (See Comments)    Affected her mind  . Zocor [Simvastatin] Other (See Comments)    unknown   Prior to Admission medications   Medication Sig Start Date End Date Taking? Authorizing Provider  albuterol (PROVENTIL) (2.5 MG/3ML) 0.083% nebulizer solution Take 2.5 mg by nebulization every 6 (six) hours as needed for wheezing or shortness of breath.    Yes Historical Provider, MD  carbidopa-levodopa (SINEMET IR) 25-100 MG tablet TAKE 1 TABLET BY MOUTH THREE TIMES DAILY 07/06/16  Yes Rebecca S Tat, DO  ipratropium (  ATROVENT) 0.02 % nebulizer solution Take 500 mcg by nebulization every 4 (four) hours as needed for wheezing or shortness of breath.    Yes Historical Provider, MD  losartan-hydrochlorothiazide (HYZAAR) 100-25 MG tablet Take 1 tablet by mouth daily. 09/30/15  Yes Historical Provider, MD  naproxen sodium (ANAPROX) 220 MG tablet Take 220 mg by mouth 2 (two) times daily as needed (pain).    Yes Historical Provider, MD  vitamin B-12 (CYANOCOBALAMIN) 1000 MCG tablet Take 1,000 mcg by mouth daily.   Yes Historical Provider, MD  HYDROcodone-acetaminophen (NORCO/VICODIN) 5-325 MG tablet Take 1 tablet by mouth every 6 (six) hours as needed for severe pain. Patient not taking: Reported on 08/01/2016 07/16/16   Gerhard Munch, MD   Dg Hip  Unilat With Pelvis 2-3 Views Right  Result Date: 08/03/2016 CLINICAL DATA:  Diffuse pain.  Fall . EXAM: DG HIP (WITH OR WITHOUT PELVIS) 2-3V RIGHT COMPARISON:  07/16/2016.  CT 07/16/2016.  Exam. FINDINGS: Angulated comminuted fracture of the right hip is noted. Deformity noted the left pubis consistent with old injury. Similar findings noted on prior exam. Diffuse osteopenia degenerative change. Peripheral vascular calcification IMPRESSION: 1. Acute right intertrochanteric hip fracture with angulation deformity. 2.  Old fractures left pubis. 3. Peripheral vascular disease . Electronically Signed   By: Maisie Fus  Register   On: 08/03/2016 10:35    Positive ROS: All other systems have been reviewed and were otherwise negative with the exception of those mentioned in the HPI and as above.  Physical Exam: General: Alert, no acute distress Cardiovascular: No pedal edema Respiratory: No cyanosis, no use of accessory musculature GI: No organomegaly, abdomen is soft and non-tender Skin: No lesions in the area of chief complaint Neurologic: Sensation intact distally Psychiatric: Patient is competent for consent with normal mood and affect Lymphatic: No axillary or cervical lymphadenopathy  MUSCULOSKELETAL:  RLE- held in ER and shortened, +NVI distally, 2+ DP   Assessment: Right hip intertrochanteric hip fracture3  Plan: -to OR today for IMN of right hip -The risks, benefits, and alternatives were discussed with the patient. There are risks associated with the surgery including, but not limited to, problems with anesthesia (death), infection, differences in leg length/angulation/rotation, fracture of bones, loosening or failure of implants, malunion, nonunion, hematoma (blood accumulation) which may require surgical drainage, blood clots, pulmonary embolism, nerve injury (foot drop), and blood vessel injury. The patient understands these risks and elects to proceed. -will return to medicine team  postoperatively for PT/OT and dc dispo.    Yolonda Kida, MD Cell [phone]    08/03/2016 5:26 PM

## 2016-08-03 NOTE — Brief Op Note (Signed)
08/01/2016 - 08/03/2016  7:02 PM  PATIENT:  Gladis Riffleharlotte F Polinsky  83 y.o. female  PRE-OPERATIVE DIAGNOSIS:  fractured right hip  POST-OPERATIVE DIAGNOSIS:  fractured right hip  PROCEDURE:  Procedure(s): attempted open reduction internal fixation right hip.manipulation right hip under anesthesia (Right)  SURGEON:  Surgeon(s) and Role:    * [name], MD - Primary  PHYSICIAN ASSISTANT:   ASSISTANTS: none   ANESTHESIA:   spinal  EBL:  No intake/output data recorded.  BLOOD ADMINISTERED:none  DRAINS: none   LOCAL MEDICATIONS USED:  NONE  SPECIMEN:  No Specimen  DISPOSITION OF SPECIMEN:  N/A  COUNTS:  YES  TOURNIQUET:  * No tourniquets in log *  DICTATION: .Note written in EPIC  PLAN OF CARE: Admit to inpatient   PATIENT DISPOSITION:  PACU - hemodynamically stable.   Delay start of Pharmacological VTE agent (>24hrs) due to surgical blood loss or risk of bleeding: not applicable

## 2016-08-03 NOTE — Anesthesia Procedure Notes (Addendum)
Spinal  Patient location during procedure: OR Start time: 08/03/2016 5:46 PM End time: 08/03/2016 5:50 PM Reason for block: at surgeon's request Staffing Resident/CRNA: Anne Fu Performed: resident/CRNA  Preanesthetic Checklist Completed: patient identified, site marked, surgical consent, pre-op evaluation, timeout performed, IV checked, risks and benefits discussed and monitors and equipment checked Spinal Block Patient position: right lateral decubitus Prep: DuraPrep Patient monitoring: heart rate, continuous pulse ox and blood pressure Approach: midline Location: L2-3 Injection technique: single-shot Needle Needle type: Pencan  Needle gauge: 24 G Needle length: 9 cm Assessment Sensory level: T6 Additional Notes Expiration date of kit checked and confirmed. Patient tolerated procedure well, without complications. X 1 attempt with noted clear CSF return. Loss of motor and sensory on exam post injection.

## 2016-08-03 NOTE — Progress Notes (Signed)
PT Cancellation Note  Patient Details Name: Patricia NettleCharlotte F Amspacher MRN: [id number] DOB: 01/16/1932   Cancelled Treatment:    Reason Eval/Treat Not Completed: Medical issues which prohibited therapy: x-ray showed acute R hip fx. Will hold PT eval until Ortho MD consults and make recommondations. Thanks. PT   Rebeca AlertJannie Maryanne Huneycutt, MPT Pager: [phone]

## 2016-08-04 ENCOUNTER — Encounter (HOSPITAL_COMMUNITY): Payer: Self-pay | Admitting: Orthopedic Surgery

## 2016-08-04 DIAGNOSIS — E440 Moderate protein-calorie malnutrition: Secondary | ICD-10-CM | POA: Insufficient documentation

## 2016-08-04 LAB — BASIC METABOLIC PANEL
Anion gap: 9 (ref 5–15)
BUN: 14 mg/dL (ref 6–20)
CO2: 24 mmol/L (ref 22–32)
Calcium: 7.9 mg/dL — ABNORMAL LOW (ref 8.9–10.3)
Chloride: 98 mmol/L — ABNORMAL LOW (ref 101–111)
Creatinine, Ser: 0.65 mg/dL (ref 0.44–1.00)
GFR calc Af Amer: >60 mL/min (ref >60)
GFR calc non Af Amer: >60 mL/min (ref >60)
Glucose, Bld: 114 mg/dL — ABNORMAL HIGH (ref 65–99)
Potassium: 3.4 mmol/L — ABNORMAL LOW (ref 3.5–5.1)
Sodium: 131 mmol/L — ABNORMAL LOW (ref 135–145)

## 2016-08-04 MED ORDER — BISACODYL 10 MG RE SUPP
10.0000 mg | Freq: Once | RECTAL | Status: AC
  Administered 2016-08-04: 10 mg via RECTAL
  Filled 2016-08-04: qty 1

## 2016-08-04 MED ORDER — SENNOSIDES-DOCUSATE SODIUM 8.6-50 MG PO TABS
1.0000 | ORAL_TABLET | Freq: Two times a day (BID) | ORAL | Status: DC
  Administered 2016-08-04 – 2016-08-09 (×10): 1 via ORAL
  Filled 2016-08-04 (×10): qty 1

## 2016-08-04 MED ORDER — MORPHINE SULFATE (PF) 2 MG/ML IV SOLN
0.5000 mg | INTRAVENOUS | Status: DC | PRN
  Administered 2016-08-04 – 2016-08-08 (×10): 0.5 mg via INTRAVENOUS
  Filled 2016-08-04 (×11): qty 1

## 2016-08-04 MED ORDER — POTASSIUM CHLORIDE CRYS ER 20 MEQ PO TBCR
40.0000 meq | EXTENDED_RELEASE_TABLET | Freq: Once | ORAL | Status: AC
  Administered 2016-08-04: 40 meq via ORAL
  Filled 2016-08-04: qty 2

## 2016-08-04 NOTE — Progress Notes (Signed)
PT Cancellation Note  Patient Details Name: Patricia Hopkins MRN: [id number] DOB: [birth year]   Cancelled Treatment:    Reason Eval/Treat Not Completed: Medical issues which prohibited therapy. Ortho notes reviewed. Await clearance from ortho re: mobility and WBS.    Sharen HeckHill, [name] Elizabeth Chai Verdejo PT [phone]  08/04/2016, 7:37 AM

## 2016-08-04 NOTE — Progress Notes (Signed)
Physical Therapy Discharge Patient Details Name: Patricia NettleCharlotte F Hopkins MRN: [id number] DOB: [birth year] Today's Date: 08/04/2016 Time:  -     Patient discharged from PT services secondary to surgery - will need to re-order PT to resume therapy services.   GP   Blanchard KelchKaren Kierston Plasencia PT [phone]   Rada HayHill, Labib Cwynar Elizabeth 08/04/2016, 4:00 PM

## 2016-08-04 NOTE — Progress Notes (Signed)
Nutrition Follow-up  DOCUMENTATION CODES:   Non-severe (moderate) malnutrition in context of acute illness/injury  INTERVENTION:   Provide Magic cup TID with meals, each supplement provides 290 kcal and 9 grams of protein  Encourage PO intakes of protein foods RD to continue to monitor  NUTRITION DIAGNOSIS:   Increased nutrient needs related to other (see comment) (hip fracture) as evidenced by estimated needs.  Ongoing.  GOAL:   Patient will meet greater than or equal to 90% of their needs  Not meeting.  MONITOR:   PO intake, Supplement acceptance, Labs, Weight trends, Skin, I & O's  ASSESSMENT:   83 y.o. female with a history of Parkinson disease, asthma, Bell's palsy, essential hypertension, osteoporosis. She presents with altered mental status with concern for medication effect. 1/30: attempted open reduction internal fixation right hip.manipulation right hip under anesthesia (Right)  Pt in room asleep. RD spoke with daughter at bedside. Pt's daughter states pt has had poor appetite and decreased intakes since she developed some hip pain. Pt was found to have a hip fracture yesterday and surgery was attempted. Pt may need additional surgery per surgery note. Pt will be NPO after midnight. Currently pt is on a regular diet. Pt's daughter states she has eaten a few bites of grits and yogurt. This has been the patient's typical intake for the past 3 weeks. Pt does not like Ensure/Boost supplements. Pt does eat yogurts and ice cream. Encouraged pt's daughter to order these items for the patient. RD will order Magic Cups as well with meals.  Pt found to have moderate depletion in temporal region. In respect to patient's comfort did not complete full NFPE.  Given moderate depletion in muscle and inadequate intakes x 3 weeks, pt meets criteria for moderate malnutrition.  Medications reviewed. Labs reviewed: Low K  Diet Order:  Diet regular Room service appropriate? Yes; Fluid  consistency: Thin Diet NPO time specified  Skin:  Reviewed, no issues  Last BM:  PTA  Height:   Ht Readings from Last 1 Encounters:  08/01/16 5' 1.5" (1.562 m)    Weight:   Wt Readings from Last 1 Encounters:  08/01/16 100 lb (45.4 kg)    Ideal Body Weight:  50 kg  BMI:  Body mass index is 18.59 kg/m.  Estimated Nutritional Needs:   Kcal:  1200-1400  Protein:  60-70g  Fluid:  1.4L/day  EDUCATION NEEDS:   Education needs addressed  Tilda FrancoLindsey Joey Lierman, MS, RD, LDN Pager: [phone] After Hours Pager: [phone]

## 2016-08-04 NOTE — Progress Notes (Signed)
   Subjective:  Patient reports pain as moderate.  Resting well.  Objective:   VITALS:   Vitals:   08/03/16 2203 08/03/16 2302 08/04/16 0139 08/04/16 0612  BP: 121/69 123/63 105/62 110/67  Pulse: 83 81 86 92  Resp: 15 16 16 16   Temp: 97.8 F (36.6 C) 98.1 F (36.7 C) 99.6 F (37.6 C) 99.5 F (37.5 C)  TempSrc: Oral Oral Axillary Axillary  SpO2: 100% 100% 100% 100%  Weight:      Height:        Neurovascular intact Sensation intact distally Incision: dressing C/D/I   Lab Results  Component Value Date   WBC 9.5 08/02/2016   HGB 11.8 (L) 08/02/2016   HCT 35.4 (L) 08/02/2016   MCV 90.5 08/02/2016   PLT 392 08/02/2016   BMET    Component Value Date/Time   NA 136 08/02/2016 0428   K 3.4 (L) 08/02/2016 0428   CL 104 08/02/2016 0428   CO2 23 08/02/2016 0428   GLUCOSE 81 08/02/2016 0428   BUN 23 (H) 08/02/2016 0428   CREATININE 0.42 (L) 08/02/2016 0428   CALCIUM 8.1 (L) 08/02/2016 0428   GFRNONAA >60 08/02/2016 0428   GFRAA >60 08/02/2016 0428     Assessment/Plan: 1 Day Post-Op   Active Problems:   Altered mental state   Asthma   Essential hypertension   Osteoporosis   Bell's palsy   Parkinson disease (HCC)   Closed right hip fracture (HCC)   Malnutrition of moderate degree  -will need 2nd surgery for definitive management -[facility] my partner Dr. Linna CapriceSwinteck and myself.  I have discussed this with the family (daughter). -will need assistance with transfer to [facility] in time for surgery, [facility] diet until Thursday night after MN   Patricia KidaJason Patrick Hopkins 08/04/2016, 11:47 AM   Patricia RuedJason P Rogers, MD [phone]

## 2016-08-04 NOTE — Progress Notes (Signed)
I agree with previous nurse's assessment. 

## 2016-08-04 NOTE — Progress Notes (Signed)
PROGRESS NOTE    Patricia Hopkins  ZOX:[id number] DOB: 1931/12/17 DOA: 08/01/2016 PCP: Frederich ChickWEBB, CAROL D, MD   Brief Narrative: Patricia NettleCharlotte F Hopkins is a 83 y.o. female with a history of Parkinson disease, asthma, Bell's palsy, essential hypertension, osteoporosis. She presents with altered mental status with concern for medication effect. She was also found to have right hips fracture.    Assessment & Plan:   Active Problems:   Altered mental state   Asthma   Essential hypertension   Osteoporosis   Bell's palsy   Parkinson disease (HCC)   Closed right hip fracture (HCC)   Malnutrition of moderate degree  1-Delirium;  Ammonia normal, CT head no acute intracranial process.  Chest x ray negative, urine culture no growth.  Unclear etiology.  Per daughter patient is back to baseline.   Right hip fracture;  Underwent attempt of IM nail 1-30, procedure aborted due to callus and bones findings.  Plan to transfer to [facility] for second sx for definitive management.   Hypoxemia;  Started on oxygen after sx 1-30. Titrate off oxygen.  NSL fluids.  Incentive spirometry.   Slurred speech; for last 3 weeks. CT head negative. Improved. Daughter notices speech gets slurred when patient has dry mouth.   Parkinson disease Patient has not been taking her medications. Mental status could be sequelae -continue Sinemet -continue Lexapro  Chronic diastolic heart failure Last EF of 60-65% on 11/26/2015 with grade 1 diastolic dysfunction. -daily weights -hold losartan while dehydrated  -NSL fluids due to LE edema.   Hyponatremia Likely secondary to dehydration. Resolved with IV fluids.   Essential hypertension -hold losartan-hydrochlorothiazide pre-op.   Asthma -continue Atrovent -continue Albuterol   Bells palsy Chronic right facial droop.    DVT prophylaxis: Lovenox.  Code Status: DNR Family Communication: Daughter at bedside.   Disposition Plan: Transfer to  [facility] when bed available.    Consultants:  Dr Aundria Rudogers.    Procedures: OR for attempt of correction of right hip fracture/      Antimicrobials:  none   Subjective: Sleepy, just received IV pain meds.  Denies dyspnea.  Daughter notice edema LE>   Objective: Vitals:   08/03/16 2203 08/03/16 2302 08/04/16 0139 08/04/16 0612  BP: 121/69 123/63 105/62 110/67  Pulse: 83 81 86 92  Resp: 15 16 16 16   Temp: 97.8 F (36.6 C) 98.1 F (36.7 C) 99.6 F (37.6 C) 99.5 F (37.5 C)  TempSrc: Oral Oral Axillary Axillary  SpO2: 100% 100% 100% 100%  Weight:      Height:        Intake/Output Summary (Last 24 hours) at 08/04/16 1101 Last data filed at 08/04/16 0500  Gross per 24 hour  Intake             1050 ml  Output               50 ml  Net             1000 ml   Filed Weights   08/01/16 1238 08/01/16 1700  Weight: 45.4 kg (100 lb) 45.4 kg (100 lb)    Examination:  General exam: Appears calm and comfortable  Respiratory system: Clear to auscultation. Respiratory effort normal. Cardiovascular system: S1 & S2 heard, RRR Gastrointestinal system: Abdomen is nondistended, soft and nontender. No organomegaly or masses felt. Normal bowel sounds heard. Central nervous system: sleepy  Extremities: moves extremity, deformity right hip Skin: No rashes, lesions or ulcers  Data Reviewed: I have personally reviewed following labs and imaging studies  CBC:  Recent Labs Lab 08/01/16 1300 08/02/16 0428  WBC 12.7* 9.5  NEUTROABS 10.7*  --   HGB 13.4 11.8*  HCT 39.5 35.4*  MCV 89.8 90.5  PLT 476* 392   Basic Metabolic Panel:  Recent Labs Lab 08/01/16 1300 08/02/16 0428  NA 131* 136  K 3.6 3.4*  CL 95* 104  CO2 23 23  GLUCOSE 80 81  BUN 27* 23*  CREATININE 0.52 0.42*  CALCIUM 8.7* 8.1*   GFR: Estimated Creatinine Clearance: 38.9 mL/min (by C-G formula based on SCr of 0.42 mg/dL (L)). Liver Function Tests:  Recent Labs Lab 08/01/16 1300 08/02/16 0428    AST 24 21  ALT 14 <5*  ALKPHOS 151* 130*  BILITOT 1.0 1.2  PROT 6.5 5.8*  ALBUMIN 3.5 3.2*   No results for input(s): LIPASE, AMYLASE in the last 168 hours.  Recent Labs Lab 08/01/16 1309  AMMONIA 22   Coagulation Profile:  Recent Labs Lab 08/01/16 1300  INR 0.98   Cardiac Enzymes: No results for input(s): CKTOTAL, CKMB, CKMBINDEX, TROPONINI in the last 168 hours. BNP (last 3 results) No results for input(s): PROBNP in the last 8760 hours. HbA1C: No results for input(s): HGBA1C in the last 72 hours. CBG: No results for input(s): GLUCAP in the last 168 hours. Lipid Profile: No results for input(s): CHOL, HDL, LDLCALC, TRIG, CHOLHDL, LDLDIRECT in the last 72 hours. Thyroid Function Tests: No results for input(s): TSH, T4TOTAL, FREET4, T3FREE, THYROIDAB in the last 72 hours. Anemia Panel: No results for input(s): VITAMINB12, FOLATE, FERRITIN, TIBC, IRON, RETICCTPCT in the last 72 hours. Sepsis Labs:  Recent Labs Lab 08/01/16 1306  LATICACIDVEN 1.48    Recent Results (from the past 240 hour(s))  Culture, blood (Routine x 2)     Status: None (Preliminary result)   Collection Time: 08/01/16 12:56 PM  Result Value Ref Range Status   Specimen Description BLOOD LEFT ANTECUBITAL  Final   Special Requests BOTTLES DRAWN AEROBIC AND ANAEROBIC 5CC EACH  Final   Culture   Final    NO GROWTH 2 DAYS Performed at [facility], [address], [place], [place] [address]    Report Status PENDING  Incomplete  Urine culture     Status: None   Collection Time: 08/01/16  1:00 PM  Result Value Ref Range Status   Specimen Description URINE, CLEAN CATCH  Final   Special Requests NONE  Final   Culture   Final    NO GROWTH Performed at [facility], [address] [place], [place] [address]    Report Status 08/02/2016 FINAL  Final  Culture, blood (Routine x 2)     Status: None (Preliminary result)   Collection Time: 08/01/16  2:00 PM  Result Value Ref Range Status    Specimen Description BLOOD RIGHT ARM  Final   Special Requests NONE  Final   Culture   Final    NO GROWTH 2 DAYS Performed at [facility], [address], [place], [place] [address]    Report Status PENDING  Incomplete  Surgical PCR screen     Status: None   Collection Time: 08/03/16  4:53 PM  Result Value Ref Range Status   MRSA, PCR NEGATIVE NEGATIVE Final   Staphylococcus aureus NEGATIVE NEGATIVE Final    Comment:        The Xpert SA Assay (FDA approved for NASAL specimens in  patients over 16 years of age), is one component of a comprehensive surveillance program.  Test performance has been validated by [facility] for patients greater than or equal to 75 year old. It is not intended to diagnose infection nor to guide or monitor treatment.          Radiology Studies: Dg C-arm 1-60 Min-no Report  Result Date: 08/03/2016 Fluoroscopy was utilized by the requesting physician.  No radiographic interpretation.   Dg Hip Operative Unilat W Or W/o Pelvis Right  Result Date: 08/03/2016 CLINICAL DATA:  Attempted internal fixation of right femoral fracture. Initial encounter. EXAM: OPERATIVE RIGHT HIP (WITH PELVIS IF PERFORMED) 2 VIEWS TECHNIQUE: Fluoroscopic spot image(s) were submitted for interpretation post-operatively. COMPARISON:  Right hip radiographs performed earlier today at 10:13 a.m. FINDINGS: Two fluoroscopic C-arm images are provided from the OR. The fracture could not be fully reduced for internal fixation at this time. IMPRESSION: The right femoral fracture could not be fully reduced for internal fixation at this time. Electronically Signed   By: Roanna Raider M.D.   On: 08/03/2016 20:26   Dg Hip Unilat With Pelvis 2-3 Views Right  Result Date: 08/03/2016 CLINICAL DATA:  Diffuse pain.  Fall . EXAM: DG HIP (WITH OR WITHOUT PELVIS) 2-3V RIGHT COMPARISON:  07/16/2016.  CT 07/16/2016.  Exam. FINDINGS: Angulated comminuted fracture of the right hip is noted.  Deformity noted the left pubis consistent with old injury. Similar findings noted on prior exam. Diffuse osteopenia degenerative change. Peripheral vascular calcification IMPRESSION: 1. Acute right intertrochanteric hip fracture with angulation deformity. 2.  Old fractures left pubis. 3. Peripheral vascular disease . Electronically Signed   By: Maisie Fus  Register   On: 08/03/2016 10:35        Scheduled Meds: . carbidopa-levodopa  1 tablet Oral TID  . clindamycin (CLEOCIN) IV  600 mg Intravenous Q6H  . enoxaparin (LOVENOX) injection  30 mg Subcutaneous Q24H   Continuous Infusions: . sodium chloride 75 mL/hr at 08/03/16 2247     LOS: 1 day    Time spent: 35 minutes.     Alba Cory, MD [facility] Pager [phone]  If 7PM-7AM, please contact night-coverage [email or web address] Password [email or web address] 08/04/2016, 11:01 AM

## 2016-08-05 DIAGNOSIS — R410 Disorientation, unspecified: Secondary | ICD-10-CM

## 2016-08-05 DIAGNOSIS — S72001A Fracture of unspecified part of neck of right femur, initial encounter for closed fracture: Secondary | ICD-10-CM

## 2016-08-05 LAB — CBC
HEMATOCRIT: 25.7 % — AB (ref 36.0–46.0)
HEMOGLOBIN: 8.9 g/dL — AB (ref 12.0–15.0)
MCH: 31.2 pg (ref 26.0–34.0)
MCHC: 34.6 g/dL (ref 30.0–36.0)
MCV: 90.2 fL (ref 78.0–100.0)
Platelets: 277 10*3/uL (ref 150–400)
RBC: 2.85 MIL/uL — ABNORMAL LOW (ref 3.87–5.11)
RDW: 14.5 % (ref 11.5–15.5)
WBC: 9.8 10*3/uL (ref 4.0–10.5)

## 2016-08-05 LAB — BASIC METABOLIC PANEL
Anion gap: 8 (ref 5–15)
BUN: 16 mg/dL (ref 6–20)
CHLORIDE: 102 mmol/L (ref 101–111)
CO2: 23 mmol/L (ref 22–32)
Calcium: 7.8 mg/dL — ABNORMAL LOW (ref 8.9–10.3)
Creatinine, Ser: 0.46 mg/dL (ref 0.44–1.00)
GFR calc Af Amer: >60 mL/min (ref >60)
GFR calc non Af Amer: >60 mL/min (ref >60)
GLUCOSE: 87 mg/dL (ref 65–99)
Potassium: 4.1 mmol/L (ref 3.5–5.1)
Sodium: 133 mmol/L — ABNORMAL LOW (ref 135–145)

## 2016-08-05 MED ORDER — BISACODYL 10 MG RE SUPP
10.0000 mg | Freq: Once | RECTAL | Status: AC
  Administered 2016-08-05: 10 mg via RECTAL
  Filled 2016-08-05: qty 1

## 2016-08-05 MED ORDER — CHLORHEXIDINE GLUCONATE 4 % EX LIQD
60.0000 mL | Freq: Once | CUTANEOUS | Status: AC
  Administered 2016-08-06: 4 via TOPICAL

## 2016-08-05 MED ORDER — CLINDAMYCIN PHOSPHATE 900 MG/50ML IV SOLN
900.0000 mg | INTRAVENOUS | Status: AC
  Administered 2016-08-06: 900 mg via INTRAVENOUS

## 2016-08-05 MED ORDER — POVIDONE-IODINE 10 % EX SWAB
2.0000 "application " | Freq: Once | CUTANEOUS | Status: AC
  Administered 2016-08-06: 2 via TOPICAL

## 2016-08-05 NOTE — Progress Notes (Signed)
[facility] called spoke with Gala Romneyoug for transport to [id number]

## 2016-08-05 NOTE — Progress Notes (Signed)
PROGRESS NOTE    Patricia NettleCharlotte F Roseman  ZOX:[id number] DOB: 02/05/1932 DOA: 08/01/2016 PCP: Frederich ChickWEBB, CAROL D, MD   Brief Narrative: Patricia Hopkins is a 83 y.o. female with a history of Parkinson disease, asthma, Bell's palsy, essential hypertension, osteoporosis. She presents with altered mental status with concern for medication effect. She was also found to have right hips fracture.    Assessment & Plan:   Active Problems:   Altered mental state   Asthma   Essential hypertension   Osteoporosis   Bell's palsy   Parkinson disease (HCC)   Closed right hip fracture (HCC)   Malnutrition of moderate degree  1-Delirium;  Ammonia normal, CT head no acute intracranial process.  Chest x ray negative, urine culture no growth.  Unclear etiology.  Per daughter patient is back to baseline.   Acute blood loss anemia;  Hb drop to 8.9 from 11. Hb on admission at 13. Hb on admission might be hemoconcentration.  Will check Anemia panel.  Will repeat hb tonight. Repeat cbc in am.  Hold Lovenox.  Check for occult blood.   Right hip fracture;  Underwent attempt of IM nail 1-30, procedure aborted due to callus and bones findings.  Plan to transfer to [facility] for second sx for definitive management. Plan for sx 2-2.   Hypoxemia; resolved.  Started on oxygen after sx 1-30. Titrate off oxygen.  NSL fluids.  Incentive spirometry.   Slurred speech; for last 3 weeks. CT head negative. Improved. Daughter notices speech gets slurred when patient has dry mouth.   Parkinson disease Patient has not been taking her medications. Mental status could be sequelae -continue Sinemet -continue Lexapro  Chronic diastolic heart failure Last EF of 60-65% on 11/26/2015 with grade 1 diastolic dysfunction. -daily weights -hold losartan while dehydrated  -NSL fluids due to LE edema.   Hyponatremia; stable.  Likely secondary to dehydration. Resolved with IV fluids.   Essential hypertension -hold  losartan-hydrochlorothiazide pre-op.   Asthma -continue Atrovent -continue Albuterol   Bells palsy Chronic right facial droop.    DVT prophylaxis: discontinue Lovenox due to drop in hb.  Code Status: DNR Family Communication: Daughter at bedside.   Disposition Plan: Transfer to [facility] when bed available.    Consultants:  Dr Aundria Rudogers.    Procedures: OR for attempt of correction of right hip fracture/      Antimicrobials:  none   Subjective: Alert, denies dyspnea.  No BM in  days. .    Objective: Vitals:   08/04/16 2004 08/04/16 2030 08/04/16 2114 08/05/16 0626  BP: (!) 92/52 (!) 99/57 104/74 (!) 105/59  Pulse:  89 90 83  Resp:  16 16 16   Temp:  97.8 F (36.6 C) 98.2 F (36.8 C) 97.6 F (36.4 C)  TempSrc:   Oral Oral  SpO2:  96% 93% 96%  Weight:      Height:        Intake/Output Summary (Last 24 hours) at 08/05/16 1015 Last data filed at 08/05/16 0900  Gross per 24 hour  Intake              840 ml  Output                0 ml  Net              840 ml   Filed Weights   08/01/16 1238 08/01/16 1700  Weight: 45.4 kg (100 lb) 45.4 kg (100 lb)    Examination:  General  exam: Appears calm and comfortable  Respiratory system: Clear to auscultation. Respiratory effort normal. Cardiovascular system: S1 & S2 heard, RRR Gastrointestinal system: Abdomen is nondistended, soft and nontender. No organomegaly or masses felt. Normal bowel sounds heard. Central nervous system: alert, answering questions.  Extremities: moves extremity, deformity right hip, clean dressing right hip, no significant hematoma.  Skin: No rashes, lesions or ulcers     Data Reviewed: I have personally reviewed following labs and imaging studies  CBC:  Recent Labs Lab 08/01/16 1300 08/02/16 0428 08/05/16 0446  WBC 12.7* 9.5 9.8  NEUTROABS 10.7*  --   --   HGB 13.4 11.8* 8.9*  HCT 39.5 35.4* 25.7*  MCV 89.8 90.5 90.2  PLT 476* 392 277   Basic Metabolic  Panel:  Recent Labs Lab 08/01/16 1300 08/02/16 0428 08/04/16 1456 08/05/16 0446  NA 131* 136 131* 133*  K 3.6 3.4* 3.4* 4.1  CL 95* 104 98* 102  CO2 23 23 24 23   GLUCOSE 80 81 114* 87  BUN 27* 23* 14 16  CREATININE 0.52 0.42* 0.65 0.46  CALCIUM 8.7* 8.1* 7.9* 7.8*   GFR: Estimated Creatinine Clearance: 38.9 mL/min (by C-G formula based on SCr of 0.46 mg/dL). Liver Function Tests:  Recent Labs Lab 08/01/16 1300 08/02/16 0428  AST 24 21  ALT 14 <5*  ALKPHOS 151* 130*  BILITOT 1.0 1.2  PROT 6.5 5.8*  ALBUMIN 3.5 3.2*   No results for input(s): LIPASE, AMYLASE in the last 168 hours.  Recent Labs Lab 08/01/16 1309  AMMONIA 22   Coagulation Profile:  Recent Labs Lab 08/01/16 1300  INR 0.98   Cardiac Enzymes: No results for input(s): CKTOTAL, CKMB, CKMBINDEX, TROPONINI in the last 168 hours. BNP (last 3 results) No results for input(s): PROBNP in the last 8760 hours. HbA1C: No results for input(s): HGBA1C in the last 72 hours. CBG: No results for input(s): GLUCAP in the last 168 hours. Lipid Profile: No results for input(s): CHOL, HDL, LDLCALC, TRIG, CHOLHDL, LDLDIRECT in the last 72 hours. Thyroid Function Tests: No results for input(s): TSH, T4TOTAL, FREET4, T3FREE, THYROIDAB in the last 72 hours. Anemia Panel: No results for input(s): VITAMINB12, FOLATE, FERRITIN, TIBC, IRON, RETICCTPCT in the last 72 hours. Sepsis Labs:  Recent Labs Lab 08/01/16 1306  LATICACIDVEN 1.48    Recent Results (from the past 240 hour(s))  Culture, blood (Routine x 2)     Status: None (Preliminary result)   Collection Time: 08/01/16 12:56 PM  Result Value Ref Range Status   Specimen Description BLOOD LEFT ANTECUBITAL  Final   Special Requests BOTTLES DRAWN AEROBIC AND ANAEROBIC 5CC EACH  Final   Culture   Final    NO GROWTH 3 DAYS Performed at [facility], [address], [place], [place] [address]    Report Status PENDING  Incomplete  Urine culture      Status: None   Collection Time: 08/01/16  1:00 PM  Result Value Ref Range Status   Specimen Description URINE, CLEAN CATCH  Final   Special Requests NONE  Final   Culture   Final    NO GROWTH Performed at [facility], [address], [place], [place] [address]    Report Status 08/02/2016 FINAL  Final  Culture, blood (Routine x 2)     Status: None (Preliminary result)   Collection Time: 08/01/16  2:00 PM  Result Value Ref Range Status   Specimen Description BLOOD RIGHT ARM  Final   Special  Requests NONE  Final   Culture   Final    NO GROWTH 3 DAYS Performed at [facility], [address] [place], [place] [address]    Report Status PENDING  Incomplete  Surgical PCR screen     Status: None   Collection Time: 08/03/16  4:53 PM  Result Value Ref Range Status   MRSA, PCR NEGATIVE NEGATIVE Final   Staphylococcus aureus NEGATIVE NEGATIVE Final    Comment:        The Xpert SA Assay (FDA approved for NASAL specimens in patients over 4 years of age), is one component of a comprehensive surveillance program.  Test performance has been validated by [facility] for patients greater than or equal to 82 year old. It is not intended to diagnose infection nor to guide or monitor treatment.          Radiology Studies: Dg C-arm 1-60 Min-no Report  Result Date: 08/03/2016 Fluoroscopy was utilized by the requesting physician.  No radiographic interpretation.   Dg Hip Operative Unilat W Or W/o Pelvis Right  Result Date: 08/03/2016 CLINICAL DATA:  Attempted internal fixation of right femoral fracture. Initial encounter. EXAM: OPERATIVE RIGHT HIP (WITH PELVIS IF PERFORMED) 2 VIEWS TECHNIQUE: Fluoroscopic spot image(s) were submitted for interpretation post-operatively. COMPARISON:  Right hip radiographs performed earlier today at 10:13 a.m. FINDINGS: Two fluoroscopic C-arm images are provided from the OR. The fracture could not be fully reduced for internal fixation at this  time. IMPRESSION: The right femoral fracture could not be fully reduced for internal fixation at this time. Electronically Signed   By: Roanna Raider M.D.   On: 08/03/2016 20:26   Dg Hip Unilat With Pelvis 2-3 Views Right  Result Date: 08/03/2016 CLINICAL DATA:  Diffuse pain.  Fall . EXAM: DG HIP (WITH OR WITHOUT PELVIS) 2-3V RIGHT COMPARISON:  07/16/2016.  CT 07/16/2016.  Exam. FINDINGS: Angulated comminuted fracture of the right hip is noted. Deformity noted the left pubis consistent with old injury. Similar findings noted on prior exam. Diffuse osteopenia degenerative change. Peripheral vascular calcification IMPRESSION: 1. Acute right intertrochanteric hip fracture with angulation deformity. 2.  Old fractures left pubis. 3. Peripheral vascular disease . Electronically Signed   By: Maisie Fus  Register   On: 08/03/2016 10:35        Scheduled Meds: . carbidopa-levodopa  1 tablet Oral TID  . enoxaparin (LOVENOX) injection  30 mg Subcutaneous Q24H  . senna-docusate  1 tablet Oral BID   Continuous Infusions:    LOS: 2 days    Time spent: 35 minutes.     Alba Cory, MD [facility] Pager [phone]  If 7PM-7AM, please contact night-coverage [email or web address] Password [email or web address] 08/05/2016, 10:15 AM

## 2016-08-05 NOTE — Progress Notes (Signed)
Report called to Darreld Mcleanee Murphy RN 5 north at [facility].

## 2016-08-05 NOTE — Progress Notes (Signed)
Patricia Hopkins is on schedule for right hip surgery at [facility] tomorrow afternoon with myself and Dr. Linna CapriceSwinteck.  Please have her moved over in time for this procedure.  She may have diet today and back to NPO tonight after Mn.  I have discussed the case with the patient's daughter and answered all questions.  The risks, benefits, and alternatives were discussed with the patient. There are risks associated with the surgery including, but not limited to, problems with anesthesia (death), infection, differences in leg length/angulation/rotation, fracture of bones, loosening or failure of implants, malunion, nonunion, hematoma (blood accumulation) which may require surgical drainage, blood clots, pulmonary embolism, nerve injury (foot drop), and blood vessel injury. The patient understands these risks and elects to proceed.   [name]

## 2016-08-06 ENCOUNTER — Inpatient Hospital Stay (HOSPITAL_COMMUNITY): Payer: Medicare Other

## 2016-08-06 ENCOUNTER — Encounter (HOSPITAL_COMMUNITY)
Admission: EM | Disposition: A | Discharge status: Skilled Nursing Facility | Payer: Self-pay | Source: Home / Self Care | Attending: Internal Medicine

## 2016-08-06 ENCOUNTER — Inpatient Hospital Stay (HOSPITAL_COMMUNITY): Payer: Medicare Other | Admitting: Anesthesiology

## 2016-08-06 ENCOUNTER — Encounter (HOSPITAL_COMMUNITY): Payer: Self-pay | Admitting: *Deleted

## 2016-08-06 DIAGNOSIS — S72101P Unspecified trochanteric fracture of right femur, subsequent encounter for closed fracture with malunion: Secondary | ICD-10-CM | POA: Diagnosis present

## 2016-08-06 DIAGNOSIS — E440 Moderate protein-calorie malnutrition: Secondary | ICD-10-CM

## 2016-08-06 DIAGNOSIS — S72001S Fracture of unspecified part of neck of right femur, sequela: Secondary | ICD-10-CM

## 2016-08-06 DIAGNOSIS — G510 Bell's palsy: Secondary | ICD-10-CM

## 2016-08-06 DIAGNOSIS — G20 Parkinson's disease: Secondary | ICD-10-CM

## 2016-08-06 DIAGNOSIS — R410 Disorientation, unspecified: Secondary | ICD-10-CM

## 2016-08-06 HISTORY — PX: FEMUR IM NAIL: SHX1597

## 2016-08-06 LAB — CBC
HCT: 29.3 % — ABNORMAL LOW (ref 36.0–46.0)
HEMATOCRIT: 25.6 % — AB (ref 36.0–46.0)
HEMOGLOBIN: 9.7 g/dL — AB (ref 12.0–15.0)
Hemoglobin: 8.7 g/dL — ABNORMAL LOW (ref 12.0–15.0)
MCH: 30.4 pg (ref 26.0–34.0)
MCH: 31.3 pg (ref 26.0–34.0)
MCHC: 33.1 g/dL (ref 30.0–36.0)
MCHC: 34 g/dL (ref 30.0–36.0)
MCV: 91.8 fL (ref 78.0–100.0)
MCV: 92.1 fL (ref 78.0–100.0)
PLATELETS: 319 10*3/uL (ref 150–400)
PLATELETS: 290 10*3/uL (ref 150–400)
RBC: 3.19 MIL/uL — ABNORMAL LOW (ref 3.87–5.11)
RBC: 2.78 MIL/uL — AB (ref 3.87–5.11)
RDW: 14.6 % (ref 11.5–15.5)
RDW: 14.8 % (ref 11.5–15.5)
WBC: 10.7 10*3/uL — ABNORMAL HIGH (ref 4.0–10.5)
WBC: 16.3 10*3/uL — AB (ref 4.0–10.5)

## 2016-08-06 LAB — CULTURE, BLOOD (ROUTINE X 2)
Culture: NO GROWTH
Culture: NO GROWTH

## 2016-08-06 LAB — PREPARE RBC (CROSSMATCH)

## 2016-08-06 LAB — ABO/RH: ABO/RH(D): A POS

## 2016-08-06 SURGERY — INSERTION, INTRAMEDULLARY ROD, FEMUR
Anesthesia: General | Site: Hip | Laterality: Right

## 2016-08-06 MED ORDER — FENTANYL CITRATE (PF) 100 MCG/2ML IJ SOLN
INTRAMUSCULAR | Status: AC
  Filled 2016-08-06: qty 2

## 2016-08-06 MED ORDER — PROPOFOL 10 MG/ML IV BOLUS
INTRAVENOUS | Status: AC
  Filled 2016-08-06: qty 20

## 2016-08-06 MED ORDER — PHENYLEPHRINE HCL 10 MG/ML IJ SOLN
INTRAVENOUS | Status: DC | PRN
  Administered 2016-08-06: 25 ug/min via INTRAVENOUS

## 2016-08-06 MED ORDER — ONDANSETRON HCL 4 MG/2ML IJ SOLN
INTRAMUSCULAR | Status: AC
  Filled 2016-08-06: qty 4

## 2016-08-06 MED ORDER — LACTATED RINGERS IV SOLN
INTRAVENOUS | Status: DC
Start: 2016-08-06 — End: 2016-08-06
  Administered 2016-08-06: 17:00:00 via INTRAVENOUS

## 2016-08-06 MED ORDER — CLINDAMYCIN PHOSPHATE 900 MG/50ML IV SOLN
INTRAVENOUS | Status: AC
  Filled 2016-08-06: qty 50

## 2016-08-06 MED ORDER — ONDANSETRON HCL 4 MG/2ML IJ SOLN
INTRAMUSCULAR | Status: AC
  Filled 2016-08-06: qty 2

## 2016-08-06 MED ORDER — LACTATED RINGERS IV SOLN
INTRAVENOUS | Status: DC | PRN
  Administered 2016-08-06: 18:00:00 via INTRAVENOUS

## 2016-08-06 MED ORDER — ONDANSETRON HCL 4 MG/2ML IJ SOLN
INTRAMUSCULAR | Status: DC | PRN
  Administered 2016-08-06: 4 mg via INTRAVENOUS

## 2016-08-06 MED ORDER — PROPOFOL 10 MG/ML IV BOLUS
INTRAVENOUS | Status: DC | PRN
  Administered 2016-08-06: 80 mg via INTRAVENOUS

## 2016-08-06 MED ORDER — ENOXAPARIN SODIUM 30 MG/0.3ML ~~LOC~~ SOLN
30.0000 mg | SUBCUTANEOUS | Status: DC
  Administered 2016-08-07 – 2016-08-10 (×4): 30 mg via SUBCUTANEOUS
  Filled 2016-08-06 (×4): qty 0.3

## 2016-08-06 MED ORDER — 0.9 % SODIUM CHLORIDE (POUR BTL) OPTIME
TOPICAL | Status: DC | PRN
  Administered 2016-08-06: 1000 mL

## 2016-08-06 MED ORDER — PHENOL 1.4 % MT LIQD: 1.0000 | OROMUCOSAL | Status: DC | PRN

## 2016-08-06 MED ORDER — CLINDAMYCIN PHOSPHATE 600 MG/50ML IV SOLN
600.0000 mg | Freq: Four times a day (QID) | INTRAVENOUS | Status: AC
  Administered 2016-08-06 – 2016-08-07 (×2): 600 mg via INTRAVENOUS
  Filled 2016-08-06 (×2): qty 50

## 2016-08-06 MED ORDER — FENTANYL CITRATE (PF) 100 MCG/2ML IJ SOLN
INTRAMUSCULAR | Status: DC | PRN
  Administered 2016-08-06: 100 ug via INTRAVENOUS
  Administered 2016-08-06: 25 ug via INTRAVENOUS

## 2016-08-06 MED ORDER — PHENYLEPHRINE HCL 10 MG/ML IJ SOLN
INTRAMUSCULAR | Status: DC | PRN
  Administered 2016-08-06: 80 ug via INTRAVENOUS

## 2016-08-06 MED ORDER — LIDOCAINE HCL (CARDIAC) 20 MG/ML IV SOLN
INTRAVENOUS | Status: DC | PRN
  Administered 2016-08-06: 80 mg via INTRAVENOUS

## 2016-08-06 MED ORDER — ACETAMINOPHEN 325 MG PO TABS
650.0000 mg | ORAL_TABLET | Freq: Four times a day (QID) | ORAL | Status: DC | PRN
  Administered 2016-08-07 – 2016-08-10 (×9): 650 mg via ORAL
  Filled 2016-08-06 (×9): qty 2

## 2016-08-06 MED ORDER — LIDOCAINE 2% (20 MG/ML) 5 ML SYRINGE
INTRAMUSCULAR | Status: AC
  Filled 2016-08-06: qty 5

## 2016-08-06 MED ORDER — MENTHOL 3 MG MT LOZG: 1.0000 | LOZENGE | OROMUCOSAL | Status: DC | PRN

## 2016-08-06 MED ORDER — FENTANYL CITRATE (PF) 100 MCG/2ML IJ SOLN: 25.0000 ug | INTRAMUSCULAR | Status: DC | PRN

## 2016-08-06 MED ORDER — PROPOFOL 10 MG/ML IV BOLUS
INTRAVENOUS | Status: AC
Start: 2016-08-06 — End: 2016-08-06
  Filled 2016-08-06: qty 20

## 2016-08-06 MED ORDER — IPRATROPIUM-ALBUTEROL 0.5-2.5 (3) MG/3ML IN SOLN: 3.0000 mL | Freq: Four times a day (QID) | RESPIRATORY_TRACT | Status: DC | PRN

## 2016-08-06 MED ORDER — HYDROCODONE-ACETAMINOPHEN 5-325 MG PO TABS: 1.0000 | ORAL_TABLET | Freq: Four times a day (QID) | ORAL | Status: DC | PRN

## 2016-08-06 MED ORDER — OXYCODONE HCL 5 MG PO TABS: 5.0000 mg | ORAL_TABLET | Freq: Once | ORAL | Status: DC | PRN

## 2016-08-06 MED ORDER — ROCURONIUM BROMIDE 50 MG/5ML IV SOSY
PREFILLED_SYRINGE | INTRAVENOUS | Status: AC
  Filled 2016-08-06: qty 10

## 2016-08-06 MED ORDER — ROCURONIUM BROMIDE 50 MG/5ML IV SOSY
PREFILLED_SYRINGE | INTRAVENOUS | Status: AC
Start: 2016-08-06 — End: 2016-08-06
  Filled 2016-08-06: qty 5

## 2016-08-06 MED ORDER — NEOSTIGMINE METHYLSULFATE 10 MG/10ML IV SOLN
INTRAVENOUS | Status: DC | PRN
  Administered 2016-08-06: 3 mg via INTRAVENOUS

## 2016-08-06 MED ORDER — ACETAMINOPHEN 650 MG RE SUPP: 650.0000 mg | Freq: Four times a day (QID) | RECTAL | Status: DC | PRN

## 2016-08-06 MED ORDER — ROCURONIUM BROMIDE 100 MG/10ML IV SOLN
INTRAVENOUS | Status: DC | PRN
  Administered 2016-08-06 (×2): 10 mg via INTRAVENOUS
  Administered 2016-08-06: 40 mg via INTRAVENOUS

## 2016-08-06 MED ORDER — SODIUM CHLORIDE 0.9 % IJ SOLN
INTRAMUSCULAR | Status: AC
  Filled 2016-08-06: qty 10

## 2016-08-06 MED ORDER — OXYCODONE HCL 5 MG/5ML PO SOLN: 5.0000 mg | Freq: Once | ORAL | Status: DC | PRN

## 2016-08-06 MED ORDER — NEOSTIGMINE METHYLSULFATE 5 MG/5ML IV SOSY
PREFILLED_SYRINGE | INTRAVENOUS | Status: AC
  Filled 2016-08-06: qty 5

## 2016-08-06 MED ORDER — SUCCINYLCHOLINE CHLORIDE 200 MG/10ML IV SOSY
PREFILLED_SYRINGE | INTRAVENOUS | Status: AC
  Filled 2016-08-06: qty 10

## 2016-08-06 MED ORDER — ALBUMIN HUMAN 5 % IV SOLN
INTRAVENOUS | Status: DC | PRN
  Administered 2016-08-06: 18:00:00 via INTRAVENOUS

## 2016-08-06 MED ORDER — GLYCOPYRROLATE 0.2 MG/ML IJ SOLN
INTRAMUSCULAR | Status: DC | PRN
  Administered 2016-08-06: .5 mg via INTRAVENOUS

## 2016-08-06 MED ORDER — SODIUM CHLORIDE 0.9 % IV SOLN: 10.0000 mL/h | Freq: Once | INTRAVENOUS | Status: DC

## 2016-08-06 SURGICAL SUPPLY — 57 items
ALCOHOL ISOPROPYL (RUBBING) (MISCELLANEOUS) ×3 IMPLANT
BIT DRILL 4.3MMS DISTAL GRDTED (BIT) ×1 IMPLANT
BNDG COHESIVE 4X5 TAN STRL (GAUZE/BANDAGES/DRESSINGS) ×3 IMPLANT
BNDG COHESIVE 6X5 TAN STRL LF (GAUZE/BANDAGES/DRESSINGS) ×6 IMPLANT
CHLORAPREP W/TINT 26ML (MISCELLANEOUS) ×3 IMPLANT
COVER PERINEAL POST (MISCELLANEOUS) IMPLANT
COVER SURGICAL LIGHT HANDLE (MISCELLANEOUS) ×3 IMPLANT
DERMABOND ADVANCED (GAUZE/BANDAGES/DRESSINGS) ×4
DERMABOND ADVANCED .7 DNX12 (GAUZE/BANDAGES/DRESSINGS) ×2 IMPLANT
DRAPE C-ARM 42X72 X-RAY (DRAPES) ×3 IMPLANT
DRAPE C-ARMOR (DRAPES) ×3 IMPLANT
DRAPE HIP W/POCKET STRL (DRAPE) ×3 IMPLANT
DRAPE IMP U-DRAPE 54X76 (DRAPES) ×6 IMPLANT
DRAPE STERI IOBAN 125X83 (DRAPES) ×6 IMPLANT
DRAPE U-SHAPE 47X51 STRL (DRAPES) ×6 IMPLANT
DRAPE UNIVERSAL PACK (DRAPES) ×3 IMPLANT
DRILL 4.3MMS DISTAL GRADUATED (BIT) ×3
DRSG AQUACEL AG ADV 3.5X 4 (GAUZE/BANDAGES/DRESSINGS) ×3 IMPLANT
DRSG AQUACEL AG ADV 3.5X14 (GAUZE/BANDAGES/DRESSINGS) ×3 IMPLANT
DRSG MEPILEX BORDER 4X4 (GAUZE/BANDAGES/DRESSINGS) ×6 IMPLANT
DRSG PAD ABDOMINAL 8X10 ST (GAUZE/BANDAGES/DRESSINGS) ×3 IMPLANT
ELECT REM PT RETURN 9FT ADLT (ELECTROSURGICAL) ×3
ELECTRODE REM PT RTRN 9FT ADLT (ELECTROSURGICAL) ×1 IMPLANT
FACESHIELD WRAPAROUND (MASK) ×9 IMPLANT
GLOVE BIO SURGEON STRL SZ 6.5 (GLOVE) ×2 IMPLANT
GLOVE BIO SURGEON STRL SZ8.5 (GLOVE) ×24 IMPLANT
GLOVE BIO SURGEONS STRL SZ 6.5 (GLOVE) ×1
GLOVE BIOGEL PI IND STRL 6.5 (GLOVE) ×1 IMPLANT
GLOVE BIOGEL PI IND STRL 8.5 (GLOVE) ×4 IMPLANT
GLOVE BIOGEL PI INDICATOR 6.5 (GLOVE) ×2
GLOVE BIOGEL PI INDICATOR 8.5 (GLOVE) ×8
GOWN STRL REUS W/ TWL LRG LVL3 (GOWN DISPOSABLE) ×1 IMPLANT
GOWN STRL REUS W/TWL 2XL LVL3 (GOWN DISPOSABLE) ×12 IMPLANT
GOWN STRL REUS W/TWL LRG LVL3 (GOWN DISPOSABLE) ×2
GUIDEPIN 3.2X17.5 THRD DISP (PIN) ×3 IMPLANT
GUIDEWIRE BALL NOSE 100CM (WIRE) ×3 IMPLANT
HFN LAG SCREW 10.5MM X 85MM (Screw) ×3 IMPLANT
KIT ROOM TURNOVER OR (KITS) ×3 IMPLANT
MANIFOLD NEPTUNE II (INSTRUMENTS) ×3 IMPLANT
MARKER SKIN DUAL TIP RULER LAB (MISCELLANEOUS) ×3 IMPLANT
NAIL AFFIXUS 11X360MM 125D RT (Nail) ×3 IMPLANT
NS IRRIG 1000ML POUR BTL (IV SOLUTION) ×3 IMPLANT
PACK GENERAL/GYN (CUSTOM PROCEDURE TRAY) ×3 IMPLANT
PAD ARMBOARD 7.5X6 YLW CONV (MISCELLANEOUS) ×6 IMPLANT
PADDING CAST ABS 4INX4YD NS (CAST SUPPLIES) ×2
PADDING CAST ABS COTTON 4X4 ST (CAST SUPPLIES) ×1 IMPLANT
SCREW BONE CORTICAL 5.0X36 (Screw) ×3 IMPLANT
SCREWDRIVER HEX TIP 3.5MM (MISCELLANEOUS) ×3 IMPLANT
STAPLER VISISTAT 35W (STAPLE) ×3 IMPLANT
STOCKINETTE IMPERVIOUS LG (DRAPES) ×3 IMPLANT
SUT MNCRL AB 3-0 PS2 27 (SUTURE) ×3 IMPLANT
SUT MON AB 2-0 CT1 27 (SUTURE) ×3 IMPLANT
SUT MON AB 2-0 CT1 36 (SUTURE) ×6 IMPLANT
SUT VIC AB 1 CT1 27 (SUTURE) ×8
SUT VIC AB 1 CT1 27XBRD ANBCTR (SUTURE) ×4 IMPLANT
TOWEL OR 17X24 6PK STRL BLUE (TOWEL DISPOSABLE) ×3 IMPLANT
TOWEL OR 17X26 10 PK STRL BLUE (TOWEL DISPOSABLE) ×6 IMPLANT

## 2016-08-06 NOTE — Progress Notes (Signed)
Craige CottaKirby PA called. Pt going to surgery tomorrow evening. Pt currently NPO after midnight. Pt currently saline locked. ?? IV fluids until surgery. Per order will keep pt saline locked for now. Will continue to monitor.

## 2016-08-06 NOTE — Care Management Important Message (Signed)
Important Message  Patient Details  Name: Patricia Hopkins MRN: [id number] Date of Birth: February 07, 1932   Medicare Important Message Given:  Yes    Gricelda Foland Stefan ChurchBratton 08/06/2016, 11:54 AM

## 2016-08-06 NOTE — Transfer of Care (Signed)
Immediate Anesthesia Transfer of Care Note  Patient: Philis NettleCharlotte F Castille  Procedure(s) Performed: Procedure(s): INTRAMEDULLARY (IM) NAIL FEMORAL (Right)  Patient Location: PACU  Anesthesia Type:General  Level of Consciousness: sedated and patient cooperative  Airway & Oxygen Therapy: Patient connected to nasal cannula oxygen  Post-op Assessment: Report given to RN and Post -op Vital signs reviewed and stable  Post vital signs: Reviewed and stable  Last Vitals:  Vitals:   08/06/16 2140 08/06/16 2150  BP: 113/71 125/61  Pulse: 86   Resp: 15   Temp:      Last Pain:  Vitals:   08/06/16 2150  TempSrc:   PainSc: 0-No pain      Patients Stated Pain Goal: 1 (08/05/16 1955)  Complications: No apparent anesthesia complications

## 2016-08-06 NOTE — Progress Notes (Signed)
Rept received from Theodora BlowMichelle McMillan RN. Pt asleep semi fowlers in bed with daughter awake at bedside. Pt easily aroused. Resps even and unlabored. No s/sx distress and no c/o such. Pt remains confused. Daughter and this RN obtained surgical consent and blood consent for surgery tomorrow. Foley draining clear yellow urine. Daughter aware pt surgery tomorrow is approx 1645 tomorrow. Daughter aware that pt is NPO after midnight. Will continue to monitor.

## 2016-08-06 NOTE — Interval H&P Note (Signed)
History and Physical Interval Note:  08/06/2016 5:18 PM  Patricia Hopkins  has presented today for surgery, with the diagnosis of RIGHT HIP FRACTURE  The various methods of treatment have been discussed with the patient and family. After consideration of risks, benefits and other options for treatment, the patient has consented to  Procedure(s): INTRAMEDULLARY (IM) NAIL FEMORAL (Right) as a surgical intervention .  The patient's history has been reviewed, patient examined, no change in status, stable for surgery.  I have reviewed the patient's chart and labs.  Questions were answered to the patient's satisfaction.     I was asked by Dr. Aundria Rudogers to assume care of the patient. In short, she has a subacute right intertrochanteric femur fx with malunion. He was unable to reduce the fracture for IM nailing. We discussed the risks, benefits, and alternatives to open reduction and IM nailing of the right femur.  The risks, benefits, and alternatives were discussed with the patient. There are risks associated with the surgery including, but not limited to, problems with anesthesia (death), infection, differences in leg length/angulation/rotation, fracture of bones, loosening or failure of implants, malunion, nonunion, hematoma (blood accumulation) which may require surgical drainage, blood clots, pulmonary embolism, nerve injury (foot drop), and blood vessel injury. The patient understands these risks and elects to proceed.   Patricia Hopkins, Patricia ReamsBrian Hopkins

## 2016-08-06 NOTE — Brief Op Note (Signed)
08/06/2016  8:52 PM  PATIENT:  Patricia Hopkins  83 y.o. female  PRE-OPERATIVE DIAGNOSIS:  RIGHT IT HIP FRACTURE  POST-OPERATIVE DIAGNOSIS:  RIGHT IT HIP FRACTURE  PROCEDURE:  Procedure(s): INTRAMEDULLARY (IM) NAIL FEMORAL (Right)  SURGEON:  Surgeon(s) and Role:    * Samson FredericBrian Adja Ruff, MD - Primary  PHYSICIAN ASSISTANT: None  ASSISTANTS: carla bethune, rnfa   ANESTHESIA:   general  EBL:  Total I/O In: 1000 [I.V.:1000] Out: 425 [Urine:175; Blood:250]  BLOOD ADMINISTERED:none  DRAINS: none   LOCAL MEDICATIONS USED:  NONE  SPECIMEN:  No Specimen  DISPOSITION OF SPECIMEN:  N/A  COUNTS:  YES  TOURNIQUET:  * No tourniquets in log *  DICTATION: .Other Dictation: Dictation Number [id number]  PLAN OF CARE: Admit to inpatient   PATIENT DISPOSITION:  PACU - hemodynamically stable.   Delay start of Pharmacological VTE agent (>24hrs) due to surgical blood loss or risk of bleeding: not applicable

## 2016-08-06 NOTE — Anesthesia Procedure Notes (Signed)
Procedure Name: Intubation Date/Time: 08/06/2016 5:41 PM Performed by: Rosiland OzMEYERS, Dejanira Pamintuan Pre-anesthesia Checklist: Patient identified, Emergency Drugs available, Suction available, Patient being monitored and Timeout performed Patient Re-evaluated:Patient Re-evaluated prior to inductionOxygen Delivery Method: Circle system utilized Preoxygenation: Pre-oxygenation with 100% oxygen Intubation Type: IV induction Ventilation: Mask ventilation without difficulty and Oral airway inserted - appropriate to patient size Laryngoscope Size: Hyacinth MeekerMiller and 2 Grade View: Grade II Tube type: Oral Tube size: 7.0 mm Number of attempts: 1 Airway Equipment and Method: Stylet Placement Confirmation: ETT inserted through vocal cords under direct vision,  positive ETCO2 and breath sounds checked- equal and bilateral Secured at: 21 cm Tube secured with: Tape Dental Injury: Teeth and Oropharynx as per pre-operative assessment

## 2016-08-06 NOTE — Anesthesia Preprocedure Evaluation (Signed)
Anesthesia Evaluation  Patient identified by MRN, date of birth, ID band Patient awake    Reviewed: Allergy & Precautions, NPO status , Patient's Chart, lab work & pertinent test results  Airway Mallampati: II       Dental  (+) Poor Dentition   Pulmonary former smoker,    Pulmonary exam normal        Cardiovascular hypertension, Pt. on medications Normal cardiovascular exam     Neuro/Psych negative psych ROS   GI/Hepatic negative GI ROS, Neg liver ROS,   Endo/Other    Renal/GU   negative genitourinary   Musculoskeletal   Abdominal Normal abdominal exam  (+)   Peds negative pediatric ROS (+)  Hematology negative hematology ROS (+)   Anesthesia Other Findings Ordering physician: Pricilla Riffle, MD Study date: 12/11/15 Patient Information   Name MRN Description LARISHA VENCILL [id number] 83 y.o. Female Result Notes   Notes Recorded by Henrietta Dine, RN on 12/11/2015 at 4:06 PM Informed patient's DPR of results and verbal understanding expressed. ------  Notes Recorded by Henrietta Dine, RN on 12/11/2015 at 3:06 PM Left message to call back. ------  Notes Recorded by Pricilla Riffle, MD on 12/11/2015 at 2:13 PM Stress test is normal Symptoms do not appear to be due to a blood flow problem to heart   Vitals   Height Weight BMI (Calculated) 5\' 1"  (1.549 m) 112 lb (50.8 kg) 21.2 Study Highlights    The left ventricular ejection fraction is hyperdynamic (>65%).  Nuclear stress EF: 81%.  There was no ST segment deviation noted during stress.  The study is normal.  This is a low risk study.   Nuclear History and Indications   History and Indications Indication for Stress Test: Diagnosis of coronary disease History: No Cardiac History Cardiac Risk Factors: Family History - CAD  Symptoms: Fatigue and Palpitations  Stress Findings   ECG Baseline ECG exhibits normal sinus rhythm and with PVCs..  Stress  Findings A pharmacological stress test was performed using IV Lexiscan 0.4mg  over 10 seconds performed without concurrent submaximal exercise.  The patient reported nausea, shortness of breath and headache during the stress test.   Test was stopped per protocol.   Recovery time: 5 minutes.  Response to Stress There was no ST segment deviation noted during stress.  Arrhythmias during stress: none.  Arrhythmias during recovery: none.  There were no significant arrhythmias noted during the test.  ECG was interpretable and non-conclusive.  Stress Measurements   Baseline Vitals Rest HR  72 bpm  Rest BP  136/81 mmHg  Peak Stress Vitals Peak HR  100 bpm  Peak BP  155/87 mmHg    Nuclear Stress Measurements   LV sys vol  5 mL  TID  1.14   LV dias vol  28 mL  LHR  0.28   SSS  11   SRS  6   SDS  5       Nuclear Stress Findings   Isotope administration Rest isotope was administered with an IV injection of 10.2 mCi technetium tetrofosmin. Rest SPECT images were obtained approximately 45 minutes post tracer injection. Stress isotope was administered with an IV injection of 32.7 mCi technetium tetrofosmin 20 seconds post IV Lexiscan administration. Stress SPECT images were obtained approximately 60 minutes post tracer injection.  Nuclear Study Quality Overall image quality is good.Breast attenuation artifact was present. Image quality affected due to significant extracardiac activity.  Nuclear Measurements Study was gated.  Rest Perfusion  Rest perfusion normal.  Stress Perfusion Stress perfusion normal.  Overall Study Impression Myocardial perfusion is normal. The study is normal. This is a low risk study. Overall left ventricular systolic function was normal. LV cavity size is normal. Nuclear stress EF: 81%. The left ventricular ejection fraction is hyperdynamic (>65%). There is no prior study for comparison.  From: ACCF/SCAI/STS/AATS/AHA/ASNC/HFSA/SCCT 2012  Appropriate Use Criteria for Coronary Revascularization Focused Update  Signed   Electronically signed by Quintella Reichert, MD on 12/11/15 at 1406 EDT Report approved and finalized on 12/11/2015 1406 Imaging   Imaging Information Order-Level Documents - 12/11/2015:   Scan on 12/11/2015 1:01 PM by Hurshel Party : Protocol and Data sheetsScan on 12/11/2015 1:01 PM by Hurshel Party : Protocol and Data sheets  Scan on 12/11/2015 1:01 PM by Hurshel Party : Consent FormScan on 12/11/2015 1:01 PM by Hurshel Party : Consent Form  Scan on 12/11/2015 1:02 PM by Hurshel Party : MPI ImageScan on 12/11/2015 1:02 PM by Hurshel Party : MPI Image  Scan on 12/11/2015 11:33 AM by Provider Default, MDScan on 12/11/2015 11:33 AM by Provider Default, MD    Encounter-Level Documents - 12/11/2015:   Electronic signature on 12/11/2015 9:43 AM  Electronic signature on 12/11/2015 9:42 AM    Exam Information   Status Exam Begun  Exam Ended  Final (99) 12/11/2015 9:44 AM 12/11/2015 12:23 PM External Result Report   External Result Report Encounter Report   Patient Encounter Report    Reproductive/Obstetrics negative OB ROS                             Anesthesia Physical  Anesthesia Plan  ASA: III  Anesthesia Plan: Spinal   Post-op Pain Management:    Induction:   Airway Management Planned:   Additional Equipment:   Intra-op Plan:   Post-operative Plan:   Informed Consent: I have reviewed the patients History and Physical, chart, labs and discussed the procedure including the risks, benefits and alternatives for the proposed anesthesia with the patient or authorized representative who has indicated his/her understanding and acceptance.   Dental advisory given  Plan Discussed with: CRNA and Surgeon  Anesthesia Plan Comments: ([facility]  ECHO COMPLETE WO IMAGE ENHANCING AGENT  Order# [id number]  Reading physician: Chilton Si, MD Ordering physician:  Pricilla Riffle, MD Study date: 11/26/15 Result Notes   Notes Recorded by Lendon Ka, RN on 12/03/2015 at 10:03 AM Spoke with patient's daughter, Dorcas Carrow. Informed of echo results and that Dr. Tenny Craw has recommended stress test. Reviewed instructions for test and that someone will call her to schedule. She verbalizes understanding and agreement. ------  Notes Recorded by Pricilla Riffle, MD on 11/26/2015 at 10:47 PM Echo shows pumping function of the heart is normal There is very mild relaxation abnormaltiy I would recomm pt be set up for Lexiscan myovue to evaluate for ischemia   Study Result   Result status: Final result                          [facility]                        [address]                        [place], [place] [address]  [phone]  ------------------------------------------------------------------- Transthoracic Echocardiography  Patient:    Meshawn, Oconnor MR #:       [id number] Study Date: 11/26/2015 Gender:     F Age:        37 Height:     152.4 cm Weight:     52 kg BSA:        1.49 m^2 Pt. Status: Room:   ATTENDING    Dietrich Pates, M.D.  ORDERING     Dietrich Pates, M.D.  REFERRING    Dietrich Pates, M.D.  SONOGRAPHER  Aida Raider, RDCS  PERFORMING   [facility]  cc:  ------------------------------------------------------------------- LV EF: 60% -   65%  ------------------------------------------------------------------- Indications:      R07.9 Chest Pain.  ------------------------------------------------------------------- History:   PMH:  Acquired from the patient and from the patient&'s chart.  PMH:  Chronic Kidney Disease.  Risk factors:  Hypertension.   ------------------------------------------------------------------- Study Conclusions  - Left ventricle: The cavity size was normal. There was mild focal   basal hypertrophy of the septum. Systolic function was normal.    The estimated ejection fraction was in the range of 60% to 65%.   Wall motion was normal; there were no regional wall motion   abnormalities. Doppler parameters are consistent with abnormal   left ventricular relaxation (grade 1 diastolic dysfunction).   Doppler parameters are consistent with high ventricular filling   pressure. - Aortic valve: Transvalvular velocity was within the normal range.   There was no stenosis. There was no regurgitation. - Mitral valve: Transvalvular velocity was within the normal range.   There was no evidence for stenosis. There was trivial   regurgitation. - Right ventricle: The cavity size was normal. Wall thickness was   normal. Systolic function was normal. - Atrial septum: No defect or patent foramen ovale was identified   by color flow Doppler. - Tricuspid valve: There was trivial regurgitation. - Pulmonary arteries: Systolic pressure was within the normal   range.  Transthoracic echocardiography.  M-mode, complete 2D, spectral Doppler, and color Doppler.  Birthdate:  Patient birthdate: Nov 24, 1931.  Age:  Patient is 83 yr old.  Sex:  Gender: female. BMI: 22.4 kg/m^2.  Blood pressure:     137/90  Patient status: Outpatient.  Study date:  Study date: 11/26/2015. Study time: 10:48 AM.  Location:  [facility]  -------------------------------------------------------------------  ------------------------------------------------------------------- Left ventricle:  The cavity size was normal. There was mild focal basal hypertrophy of the septum. Systolic function was normal. The estimated ejection fraction was in the range of 60% to 65%. Wall motion was normal; there were no regional wall motion abnormalities. Doppler parameters are consistent with abnormal left ventricular relaxation (grade 1 diastolic dysfunction). Doppler parameters are consistent with high ventricular filling pressure.    ------------------------------------------------------------------- Aortic valve:   Trileaflet; normal thickness leaflets. Mobility was not restricted.  Doppler:  Transvalvular velocity was within the normal range. There was no stenosis. There was no regurgitation.   ------------------------------------------------------------------- Aorta:  Aortic root: The aortic root was normal in size.  ------------------------------------------------------------------- Mitral valve:   Structurally normal valve.   Mobility was not restricted.  Doppler:  Transvalvular velocity was within the normal range. There was no evidence for stenosis. There was trivial regurgitation.  ------------------------------------------------------------------- Left atrium:  The atrium was normal in size.  ------------------------------------------------------------------- Atrial septum:  No defect or patent foramen ovale was identified by color flow Doppler.  -------------------------------------------------------------------  ECHO COMPLETE WO IMAGE ENHANCING AGENT  Order# [id number]  Reading physician: Elmarie Shiley  Duke Salviaandolph, MD Ordering physician: Pricilla RifflePaula Ross V, MD Study date: 11/26/15 Result Notes   Notes Recorded by Lendon KaMichalene Wilson, RN on 12/03/2015 at 10:03 AM Spoke with patient's daughter, Dorcas Carrowngie Lyman. Informed of echo results and that Dr. Tenny Crawoss has recommended stress test. Reviewed instructions for test and that someone will call her to schedule. She verbalizes understanding and agreement. ------  Notes Recorded by Pricilla RifflePaula Ross V, MD on 11/26/2015 at 10:47 PM Echo shows pumping function of the heart is normal There is very mild relaxation abnormaltiy I would recomm pt be set up for Lexiscan myovue to evaluate for ischemia   Study Result   Result status: Final result                          [facility]                        [address]                        [place], [place] [address]                             [phone]  ------------------------------------------------------------------- Transthoracic Echocardiography  Patient:    Philis NettleDeeming, Lulia F MR #:       [id number] Study Date: 11/26/2015 Gender:     F Age:        [age] Height:     152.4 cm Weight:     52 kg BSA:        1.49 m^2 Pt. Status: Room:   ATTENDING    Dietrich PatesPaula Ross, M.D.  ORDERING     Dietrich PatesPaula Ross, M.D.  REFERRING    Dietrich PatesPaula Ross, M.D.  SONOGRAPHER  Aida RaiderNaTashia Rodgers, RDCS  PERFORMING   [facility]  cc:  ------------------------------------------------------------------- LV EF: 60% -   65%  ------------------------------------------------------------------- Indications:      R07.9 Chest Pain.  ------------------------------------------------------------------- History:   PMH:  Acquired from the patient and from the patient&'s chart.  PMH:  Chronic Kidney Disease.  Risk factors:  Hypertension.   ------------------------------------------------------------------- Study Conclusions  - Left ventricle: The cavity size was normal. There was mild focal   basal hypertrophy of the septum. Systolic function was normal.   The estimated ejection fraction was in the range of 60% to 65%.   Wall motion was normal; there were no regional wall motion   abnormalities. Doppler parameters are consistent with abnormal   left ventricular relaxation (grade 1 diastolic dysfunction).   Doppler parameters are consistent with high ventricular filling   pressure. - Aortic valve: Transvalvular velocity was within the normal range.   There was no stenosis. There was no regurgitation. - Mitral valve: Transvalvular velocity was within the normal range.   There was no evidence for stenosis. There was trivial   regurgitation. - Right ventricle: The cavity size was normal. Wall thickness was   normal. Systolic function was normal. - Atrial septum: No defect or patent foramen ovale was identified   by color  flow Doppler. - Tricuspid valve: There was trivial regurgitation. - Pulmonary arteries: Systolic pressure was within the normal   range.  Transthoracic echocardiography.  M-mode, complete 2D, spectral Doppler, and color Doppler.  Birthdate:  Patient birthdate: [birth year].  Age:  Patient is 83 yr old.  Sex:  Gender: female. BMI: 22.4 kg/m^2.  Blood pressure:  137/90  Patient status: Outpatient.  Study date:  Study date: 11/26/2015. Study time: 10:48 AM.  Location:   [facility]  -------------------------------------------------------------------  ------------------------------------------------------------------- Left ventricle:  The cavity size was normal. There was mild focal basal hypertrophy of the septum. Systolic function was normal. The estimated ejection fraction was in the range of 60% to 65%. Wall motion was normal; there were no regional wall motion abnormalities. Doppler parameters are consistent with abnormal left ventricular relaxation (grade 1 diastolic dysfunction). Doppler parameters are consistent with high ventricular filling pressure.   ------------------------------------------------------------------- Aortic valve:   Trileaflet; normal thickness leaflets. Mobility was not restricted.  Doppler:  Transvalvular velocity was within the normal range. There was no stenosis. There was no regurgitation.   ------------------------------------------------------------------- Aorta:  Aortic root: The aortic root was normal in size.  ------------------------------------------------------------------- Mitral valve:   Structurally normal valve.   Mobility was not restricted.  Doppler:  Transvalvular velocity was within the normal range. There was no evidence for stenosis. There was trivial regurgitation.  ------------------------------------------------------------------- Left atrium:  The atrium was normal in  size.  ------------------------------------------------------------------- Atrial septum:  No defect or patent foramen ovale was identified by color flow Doppler.  ------------------------------------------------------------------- Right ventricle:  The cavity size was normal. Wall thickness was normal. Systolic function was normal.  ------------------------------------------------------------------- Pulmonic valve:    Structurally normal valve.   Cusp separation was normal.  Doppler:  Transvalvular velocity was within the normal range. There was no evidence for stenosis. There was no regurgitation.  ------------------------------------------------------------------- Tricuspid valve:   Structurally normal valve.    Doppler: Transvalvular velocity was within the normal range. There was trivial regurgitation.  ------------------------------------------------------------------- Pulmonary artery:   The main pulmonary artery was normal-sized. Systolic pressure was within the normal range.  ------------------------------------------------------------------- Right atrium:  The atrium was normal in size.  ------------------------------------------------------------------- Pericardium:  There was no pericardial effusion.  ------------------------------------------------------------------- Systemic veins: Inferior vena cava: The vessel was normal in size. The respirophasic diameter changes were in the normal range (>= 50%), consistent with normal central venous pressure.  ------------------------------------------------------------------- Measurements   Left ventricle                         Value        Reference  LV ID, ED, PLAX chordal        (L)     37.7  mm     43 - 52  LV ID, ES, PLAX chordal        (L)     22.4  mm     23 - 38  LV fx shortening, PLAX chordal         41    %      >=29  LV PW thickness, ED                    8.47  mm     ---------  IVS/LV PW ratio,  ED            (H)     1.49         <=1.3  Stroke volume, 2D                      58    ml     ---------  Stroke volume/bsa, 2D                  39    ml/m^2 ---------  LV  e&', lateral                         4.03  cm/s   ---------  LV E/e&', lateral                       17.47        ---------  LV e&', medial                          3.37  cm/s   ---------  LV E/e&', medial                        20.89        ---------  LV e&', average                         3.7   cm/s   ---------  LV E/e&', average                       19.03        ---------    Ventricular septum                     Value        Reference  IVS thickness, ED                      12.6  mm     ---------    LVOT                                   Value        Reference  LVOT ID, S                             19    mm     ---------  LVOT area                              2.84  cm^2   ---------  LVOT ID                                19    mm     ---------  LVOT peak velocity, S                  99.4  cm/s   ---------  LVOT mean velocity, S                  65.6  cm/s   ---------  LVOT VTI, S                            20.3  cm     ---------  Stroke volume (SV), LVOT DP            57.6  ml     ---------  Stroke index (SV/bsa), LVOT DP         38.6  ml/m^2 ---------    Aorta  Value        Reference  Aortic root ID, ED                     29    mm     ---------    Left atrium                            Value        Reference  LA ID, A-P, ES                         41    mm     ---------  LA ID/bsa, A-P                 (H)     2.75  cm/m^2 <=2.2  LA volume, S                           28.8  ml     ---------  LA volume/bsa, S                       19.3  ml/m^2 ---------  LA volume, ES, 1-p A4C                 32.7  ml     ---------  LA volume/bsa, ES, 1-p A4C             22    ml/m^2 ---------  LA volume, ES, 1-p A2C                 24.6  ml     ---------  LA volume/bsa, ES, 1-p A2C              16.5  ml/m^2 ---------    Mitral valve                           Value        Reference  Mitral E-wave peak velocity            70.4  cm/s   ---------  Mitral A-wave peak velocity            135   cm/s   ---------  Mitral deceleration time       (H)     356   ms     150 - 230  Mitral E/A ratio, peak                 0.5          ---------    Pulmonary arteries                     Value        Reference  PA pressure, S, DP                     21    mm Hg  <=30    Tricuspid valve                        Value        Reference  Tricuspid regurg peak velocity         211   cm/s   ---------  Tricuspid peak RV-RA gradient  18    mm Hg  ---------    Systemic veins                         Value        Reference  Estimated CVP                          3     mm Hg  ---------    Right ventricle                        Value        Reference  TAPSE                                  21.1  mm     ---------  RV pressure, S, DP                     21    mm Hg  <=30  RV s&', lateral, S                      17.65 cm/s   ---------  Legend: (L)  and  (H)  mark values outside specified reference range.  ------------------------------------------------------------------- Prepared and Electronically Authenticated by  Chilton Si, MD 2017-05-24T13:29:06 PACS Images   Show images for ECHOCARDIOGRAM COMPLETE Patient Information   Patient Name Marjorie, Deprey Sex Female DOB 1932-06-09 SSN [id number] Reason For Exam  Priority: Routine  Chest Pain 786.50 / R07.9 Dx: Other fatigue (R53.83 (ICD-10-CM)); Chest pain, unspecified chest pain type (R07.9 (ICD-10-CM)) Surgical History   Surgical History    No past medical history on file.  Other Surgical History    Procedure Laterality Date Comment Source ABDOMINAL HYSTERECTOMY    Provider APPENDECTOMY    Provider CATARACT EXTRACTION Right   Provider CHOLECYSTECTOMY    Provider EYE SURGERY Left   Provider HERNIA  REPAIR   ventral Provider ROTATOR CUFF REPAIR Left   Provider  Performing Technologist/Nurse   Performing Technologist/Nurse: Natashia Rodgers-Jones          Implants     No active implants to display in this view. Order-Level Documents:   There are no order-level documents.  Encounter-Level Documents - 11/26/2015:   Electronic signature on 11/26/2015 10:08 AM  Electronic signature on 11/26/2015 10:08 AM    Signed   Electronically signed by Chilton Si, MD on 11/26/15 at 1329 EDT Printable Result Report   Result Report  )        Anesthesia Quick Evaluation

## 2016-08-06 NOTE — Progress Notes (Addendum)
Patient ID: Patricia Hopkins, female   DOB: March 04, 1932, 83 y.o.   MRN: [id number]  PROGRESS NOTE    Patricia Hopkins  JYN:[id number] DOB: 09-30-1931 DOA: 08/01/2016  PCP: Frederich Chick, MD   Brief Narrative:  83 y.o.female with a history of Parkinson disease, asthma, Bell's palsy, essential hypertension, osteoporosis who presented to ED status post fall and right hip fracture. She underwent closed reduction and attempted open reduction of hip fracture 1/30 but per ortho needs another surgery today as well.   Assessment & Plan:   Delirium - Likely from history of Parkinson's disease - Ammonia normal - CT head no acute intracranial process.  - Chest x ray negative, urine culture no growth.   Acute blood loss anemia - Hb drop to 8.9 from 11. Hb on admission at 13 - Held Lovenox - Hemoglobin today 8.9  Acute right hip fracture - Underwent attempt of IM nail 1-30, procedure aborted due to callus and bones findings.  - Transferred to [facility] 2/1 for surgery today   Acute respiratory failure with hypoxia - No acute cardiopulmonary findings on CXR - Stable oxygenation  Slurred speech - CT head negative - Will re-eval again after surgery but apparently slurred speech when mouth dry   Moderate protein calorie malnutrition - In the context of acute illness - Seen by dietician    Parkinson disease - Stable   Chronic diastolic heart failure - Last EF of 60-65% on 11/26/2015 with grade 1 diastolic dysfunction. - Continue daily weight  Hyponatremia - Likely secondary to dehydration.  Essential hypertension BP stable, holding losartan-hydrochlorothiazide   Asthma - Not in acute exacerbation - Stable    Bells palsy - Chronic right facial droop.     DVT prophylaxis: SCD's bilaterally (was on Lovenox but this was stopped due to drop in hemoglobin) Code Status: DNR/DNI  Family Communication: daughter at the bedside this am Disposition Plan: plan for surgery  today (transferred to [facility] for surgery)   Consultants:   Orthopedic surgery   Procedures:   Closed reduction of right hip and attempted open reduction of the right hip fracture 08/03/2016  Antimicrobials:   None    Subjective: No overnight events.  Objective: Vitals:   08/06/16 0626 08/06/16 1014 08/06/16 1151 08/06/16 1443  BP: 105/60 129/76  115/62  Pulse: 90 91 80 89  Resp: 16 14 16 15   Temp: 98 F (36.7 C)   97.6 F (36.4 C)  TempSrc: Oral   Oral  SpO2: 97% 100% 96% 97%  Weight: 51.1 kg (112 lb 10.5 oz)     Height:        Intake/Output Summary (Last 24 hours) at 08/06/16 1453 Last data filed at 08/06/16 0739  Gross per 24 hour  Intake                0 ml  Output                0 ml  Net                0 ml   Filed Weights   08/01/16 1238 08/01/16 1700 08/06/16 0626  Weight: 45.4 kg (100 lb) 45.4 kg (100 lb) 51.1 kg (112 lb 10.5 oz)    Examination:  General exam: Appears calm and comfortable  Respiratory system: Clear to auscultation. Respiratory effort normal. Cardiovascular system: S1 & S2 heard, Rate controlled  Gastrointestinal system: Abdomen is nondistended, soft and nontender. No organomegaly or masses felt. Normal bowel  sounds heard. Central nervous system: right facial droop chronic Extremities: No swelling, palpable pulses  Skin: No rashes, lesions or ulcers Psychiatry: Judgement and insight appear normal. Mood & affect appropriate.   Data Reviewed: I have personally reviewed following labs and imaging studies  CBC:  Recent Labs Lab 08/01/16 1300 08/02/16 0428 08/05/16 0446  WBC 12.7* 9.5 9.8  NEUTROABS 10.7*  --   --   HGB 13.4 11.8* 8.9*  HCT 39.5 35.4* 25.7*  MCV 89.8 90.5 90.2  PLT 476* 392 277   Basic Metabolic Panel:  Recent Labs Lab 08/01/16 1300 08/02/16 0428 08/04/16 1456 08/05/16 0446  NA 131* 136 131* 133*  K 3.6 3.4* 3.4* 4.1  CL 95* 104 98* 102  CO2 23 23 24 23   GLUCOSE 80 81 114* 87  BUN 27* 23* 14 16    CREATININE 0.52 0.42* 0.65 0.46  CALCIUM 8.7* 8.1* 7.9* 7.8*   GFR: Estimated Creatinine Clearance: 41.9 mL/min (by C-G formula based on SCr of 0.46 mg/dL). Liver Function Tests:  Recent Labs Lab 08/01/16 1300 08/02/16 0428  AST 24 21  ALT 14 <5*  ALKPHOS 151* 130*  BILITOT 1.0 1.2  PROT 6.5 5.8*  ALBUMIN 3.5 3.2*   No results for input(s): LIPASE, AMYLASE in the last 168 hours.  Recent Labs Lab 08/01/16 1309  AMMONIA 22   Coagulation Profile:  Recent Labs Lab 08/01/16 1300  INR 0.98   Cardiac Enzymes: No results for input(s): CKTOTAL, CKMB, CKMBINDEX, TROPONINI in the last 168 hours. BNP (last 3 results) No results for input(s): PROBNP in the last 8760 hours. HbA1C: No results for input(s): HGBA1C in the last 72 hours. CBG: No results for input(s): GLUCAP in the last 168 hours. Lipid Profile: No results for input(s): CHOL, HDL, LDLCALC, TRIG, CHOLHDL, LDLDIRECT in the last 72 hours. Thyroid Function Tests: No results for input(s): TSH, T4TOTAL, FREET4, T3FREE, THYROIDAB in the last 72 hours. Anemia Panel: No results for input(s): VITAMINB12, FOLATE, FERRITIN, TIBC, IRON, RETICCTPCT in the last 72 hours. Urine analysis:    Component Value Date/Time   COLORURINE AMBER (A) 08/01/2016 1300   APPEARANCEUR CLEAR 08/01/2016 1300   LABSPEC 1.033 (H) 08/01/2016 1300   PHURINE 5.0 08/01/2016 1300   GLUCOSEU NEGATIVE 08/01/2016 1300   HGBUR NEGATIVE 08/01/2016 1300   BILIRUBINUR NEGATIVE 08/01/2016 1300   KETONESUR 20 (A) 08/01/2016 1300   PROTEINUR NEGATIVE 08/01/2016 1300   NITRITE NEGATIVE 08/01/2016 1300   LEUKOCYTESUR NEGATIVE 08/01/2016 1300   Sepsis Labs: @LABRCNTIP (procalcitonin:4,lacticidven:4)   Culture, blood (Routine x 2)     Status: None (Preliminary result)   Collection Time: 08/01/16 12:56 PM  Result Value Ref Range Status   Specimen Description BLOOD LEFT ANTECUBITAL  Final   Special Requests BOTTLES DRAWN AEROBIC AND ANAEROBIC 5CC EACH   Final   Culture   Final    NO GROWTH 4 DAYS Performed at [facility], [address], [place], [place] [address]    Report Status PENDING  Incomplete  Urine culture     Status: None   Collection Time: 08/01/16  1:00 PM  Result Value Ref Range Status   Specimen Description URINE, CLEAN CATCH  Final   Special Requests NONE  Final   Culture   Final    NO GROWTH Performed at [facility], [address], [place], [place] [address]    Report Status 08/02/2016 FINAL  Final  Culture, blood (Routine x 2)     Status: None (Preliminary  result)   Collection Time: 08/01/16  2:00 PM  Result Value Ref Range Status   Specimen Description BLOOD RIGHT ARM  Final   Special Requests NONE  Final   Culture   Final    NO GROWTH 4 DAYS Performed at [facility], [address] [place], [place] [address]    Report Status PENDING  Incomplete  Surgical PCR screen     Status: None   Collection Time: 08/03/16  4:53 PM  Result Value Ref Range Status   MRSA, PCR NEGATIVE NEGATIVE Final   Staphylococcus aureus NEGATIVE NEGATIVE Final      Radiology Studies: Dg C-arm 1-60 Min-no Report Result Date: 08/03/2016 Fluoroscopy was utilized by the requesting physician.  No radiographic interpretation.   Dg Hip Operative Unilat W Or W/o Pelvis Right Result Date: 08/03/2016 The right femoral fracture could not be fully reduced for internal fixation at this time.    Dg Hip Unilat With Pelvis 2-3 Views Right Result Date: 08/03/2016 Acute right intertrochanteric hip fracture with angulation deformity. 2.  Old fractures left pubis. 3. Peripheral vascular disease . Electronically Signed   By: Maisie Fushomas  Register   On: 08/03/2016 10:35      Scheduled Meds: . carbidopa-levodopa  1 tablet Oral TID  . clindamycin (CLEOCIN) IV  900 mg Intravenous To SSTC  . senna-docusate  1 tablet Oral BID   Continuous Infusions:   LOS: 3 days    Time spent: 25 minutes Greater than 50% of the time spent on  counseling and coordinating the care.   Manson PasseyEVINE, Aryani Daffern, MD [facility] Pager [phone]  If 7PM-7AM, please contact night-coverage [email or web address] Password [email or web address] 08/06/2016, 2:53 PM

## 2016-08-06 NOTE — Anesthesia Postprocedure Evaluation (Addendum)
Anesthesia Post Note  Patient: Patricia NettleCharlotte F Hopkins  Procedure(s) Performed: Procedure(s) (LRB): INTRAMEDULLARY (IM) NAIL FEMORAL (Right)  Patient location during evaluation: PACU Anesthesia Type: General Level of consciousness: sedated Pain management: pain level controlled Vital Signs Assessment: post-procedure vital signs reviewed and stable Respiratory status: spontaneous breathing and respiratory function stable Cardiovascular status: stable Anesthetic complications: no       Last Vitals:  Vitals:   08/06/16 2150 08/06/16 2200  BP: 125/61   Pulse: 83   Resp:    Temp:  36.2 C    Last Pain:  Vitals:   08/06/16 2200  TempSrc:   PainSc: 0-No pain    LLE Motor Response: Purposeful movement (08/06/16 2200) LLE Sensation: Full sensation (08/06/16 2200) RLE Motor Response: Purposeful movement (08/06/16 2200) RLE Sensation: Full sensation (08/06/16 2200)      Bailen Geffre DANIEL

## 2016-08-06 NOTE — H&P (View-Only) (Signed)
 ORTHOPAEDIC CONSULTATION  REQUESTING PHYSICIAN: Ralph A Nettey, MD  PCP:  WEBB, CAROL D, MD  Chief Complaint: Right hip fracture  HPI: Patricia Hopkins is a 83 y.o. female who complains of right hip pain for some time now.  She did have some hip pain in the right side about 3 weeks ago without any radiographic signs on xray or CT scan of fracture.  She has been bed bound since that time and was found during her hospital admission, for altered mental status, to have a right intertrochanteric hip fracture.    Currently unable to bear weight.  She is a poor historian with AMS and hx of Parkinson's and the majority of this HPI is obtained through communication with her daughter.  Past Medical History:  Diagnosis Date  . Arthritis   . Asthma   . Bell's palsy 1959   R side  . Chronic kidney disease   . Fatty liver   . Hypertension   . Macular degeneration   . Osteoporosis   . Parkinson disease (HCC)    Past Surgical History:  Procedure Laterality Date  . ABDOMINAL HYSTERECTOMY    . APPENDECTOMY    . CATARACT EXTRACTION Right   . CHOLECYSTECTOMY    . EYE SURGERY Left   . HERNIA REPAIR     ventral  . ROTATOR CUFF REPAIR Left    Social History   Social History  . Marital status: Widowed    Spouse name: N/A  . Number of children: N/A  . Years of education: N/A   Social History Main Topics  . Smoking status: Former Smoker    Quit date: 07/05/1976  . Smokeless tobacco: Former User    Quit date: 08/04/1976  . Alcohol use No  . Drug use: No  . Sexual activity: No   Other Topics Concern  . None   Social History Narrative  . None   Family History  Problem Relation Age of Onset  . Parkinson's disease Daughter    Allergies  Allergen Reactions  . Cepacol [Cetylpyridinium] Other (See Comments)    unknown  . Crestor [Rosuvastatin] Other (See Comments)    No energy  . Dual Action Complete [Famotidine-Ca Carb-Mag Hydrox] Other (See Comments)    unknown  . Fosamax  [Alendronate Sodium]     Nausea     Has taken Reclast  One other time did not have any problems  . Penicillins Other (See Comments)    Has patient had a PCN reaction causing immediate rash, facial/tongue/throat swelling, SOB or lightheadedness with hypotension: unknown Has patient had a PCN reaction causing severe rash involving mucus membranes or skin necrosis:unknown Has patient had a PCN reaction that required hospitalization : unknown Has patient had a PCN reaction occurring within the last 10 years: no If all of the above answers are "NO", then may proceed with Cephalosporin use.   . Pravachol [Pravastatin Sodium] Other (See Comments)  . Valium [Diazepam] Other (See Comments)    Affected her mind  . Zocor [Simvastatin] Other (See Comments)    unknown   Prior to Admission medications   Medication Sig Start Date End Date Taking? Authorizing Provider  albuterol (PROVENTIL) (2.5 MG/3ML) 0.083% nebulizer solution Take 2.5 mg by nebulization every 6 (six) hours as needed for wheezing or shortness of breath.    Yes Historical Provider, MD  carbidopa-levodopa (SINEMET IR) 25-100 MG tablet TAKE 1 TABLET BY MOUTH THREE TIMES DAILY 07/06/16  Yes Rebecca S Tat, DO  ipratropium (  ATROVENT) 0.02 % nebulizer solution Take 500 mcg by nebulization every 4 (four) hours as needed for wheezing or shortness of breath.    Yes Historical Provider, MD  losartan-hydrochlorothiazide (HYZAAR) 100-25 MG tablet Take 1 tablet by mouth daily. 09/30/15  Yes Historical Provider, MD  naproxen sodium (ANAPROX) 220 MG tablet Take 220 mg by mouth 2 (two) times daily as needed (pain).    Yes Historical Provider, MD  vitamin B-12 (CYANOCOBALAMIN) 1000 MCG tablet Take 1,000 mcg by mouth daily.   Yes Historical Provider, MD  HYDROcodone-acetaminophen (NORCO/VICODIN) 5-325 MG tablet Take 1 tablet by mouth every 6 (six) hours as needed for severe pain. Patient not taking: Reported on 08/01/2016 07/16/16   Robert Lockwood, MD   Dg Hip  Unilat With Pelvis 2-3 Views Right  Result Date: 08/03/2016 CLINICAL DATA:  Diffuse pain.  Fall . EXAM: DG HIP (WITH OR WITHOUT PELVIS) 2-3V RIGHT COMPARISON:  07/16/2016.  CT 07/16/2016.  Exam. FINDINGS: Angulated comminuted fracture of the right hip is noted. Deformity noted the left pubis consistent with old injury. Similar findings noted on prior exam. Diffuse osteopenia degenerative change. Peripheral vascular calcification IMPRESSION: 1. Acute right intertrochanteric hip fracture with angulation deformity. 2.  Old fractures left pubis. 3. Peripheral vascular disease . Electronically Signed   By: Thomas  Register   On: 08/03/2016 10:35    Positive ROS: All other systems have been reviewed and were otherwise negative with the exception of those mentioned in the HPI and as above.  Physical Exam: General: Alert, no acute distress Cardiovascular: No pedal edema Respiratory: No cyanosis, no use of accessory musculature GI: No organomegaly, abdomen is soft and non-tender Skin: No lesions in the area of chief complaint Neurologic: Sensation intact distally Psychiatric: Patient is competent for consent with normal mood and affect Lymphatic: No axillary or cervical lymphadenopathy  MUSCULOSKELETAL:  RLE- held in ER and shortened, +NVI distally, 2+ DP   Assessment: Right hip intertrochanteric hip fracture3  Plan: -to OR today for IMN of right hip -The risks, benefits, and alternatives were discussed with the patient. There are risks associated with the surgery including, but not limited to, problems with anesthesia (death), infection, differences in leg length/angulation/rotation, fracture of bones, loosening or failure of implants, malunion, nonunion, hematoma (blood accumulation) which may require surgical drainage, blood clots, pulmonary embolism, nerve injury (foot drop), and blood vessel injury. The patient understands these risks and elects to proceed. -will return to medicine team  postoperatively for PT/OT and dc dispo.    [name], MD Cell [phone]    08/03/2016 5:26 PM  

## 2016-08-07 ENCOUNTER — Inpatient Hospital Stay (HOSPITAL_COMMUNITY): Payer: Medicare Other

## 2016-08-07 DIAGNOSIS — J9601 Acute respiratory failure with hypoxia: Secondary | ICD-10-CM

## 2016-08-07 DIAGNOSIS — S72101P Unspecified trochanteric fracture of right femur, subsequent encounter for closed fracture with malunion: Secondary | ICD-10-CM

## 2016-08-07 LAB — BASIC METABOLIC PANEL
ANION GAP: 13 (ref 5–15)
BUN: 13 mg/dL (ref 6–20)
CHLORIDE: 97 mmol/L — AB (ref 101–111)
CO2: 23 mmol/L (ref 22–32)
Calcium: 8.1 mg/dL — ABNORMAL LOW (ref 8.9–10.3)
Creatinine, Ser: 0.43 mg/dL — ABNORMAL LOW (ref 0.44–1.00)
GFR calc non Af Amer: >60 mL/min (ref >60)
Glucose, Bld: 92 mg/dL (ref 65–99)
POTASSIUM: 5.1 mmol/L (ref 3.5–5.1)
SODIUM: 133 mmol/L — AB (ref 135–145)

## 2016-08-07 LAB — CBC
HEMATOCRIT: 26.8 % — AB (ref 36.0–46.0)
HEMOGLOBIN: 8.7 g/dL — AB (ref 12.0–15.0)
MCH: 29.8 pg (ref 26.0–34.0)
MCHC: 32.5 g/dL (ref 30.0–36.0)
MCV: 91.8 fL (ref 78.0–100.0)
Platelets: 383 10*3/uL (ref 150–400)
RBC: 2.92 MIL/uL — AB (ref 3.87–5.11)
RDW: 14.6 % (ref 11.5–15.5)
WBC: 11.5 10*3/uL — ABNORMAL HIGH (ref 4.0–10.5)

## 2016-08-07 LAB — CREATININE, SERUM: CREATININE: 0.38 mg/dL — AB (ref 0.44–1.00)

## 2016-08-07 NOTE — Progress Notes (Signed)
     Subjective: 1 Day Post-Op Procedure(s) (LRB): INTRAMEDULLARY (IM) NAIL FEMORAL (Right)   Patient expressing no real pain.  Patient family expressing concern of medications, Norco and that she should not be on this as it is suspect in causing her previous altered mental status.  Family states that she is having swelling on the left side of the body.  Medicine is evaluating / treating her.  Objective:   VITALS:   Vitals:   08/07/16 0440 08/07/16 1444  BP: (!) 102/54 111/66  Pulse: 78 97  Resp:  18  Temp: 97.9 F (36.6 C) 98.9 F (37.2 C)    Incision: dressing C/D/I No cellulitis present Compartment soft  LABS  Recent Labs  08/06/16 1537 08/06/16 2301 08/07/16 0916  HGB 9.7* 8.7* 8.7*  HCT 29.3* 25.6* 26.8*  WBC 10.7* 16.3* 11.5*  PLT 319 290 383     Recent Labs  08/05/16 0446 08/06/16 2301 08/07/16 0916  NA 133*  --  133*  K 4.1  --  5.1  BUN 16  --  13  CREATININE 0.46 0.38* 0.43*  GLUCOSE 87  --  92     Assessment/Plan: 1 Day Post-Op Procedure(s) (LRB): INTRAMEDULLARY (IM) NAIL FEMORAL (Right) Up with therapy Appreciate medicine and their care for the patient Discharge disposition to be determined    Anastasio AuerbachMatthew S. Latoiya Maradiaga   PAC  08/07/2016, 7:01 PM

## 2016-08-07 NOTE — Progress Notes (Signed)
I spoke with Md Elisabeth Pigeon(Devine) about pts inability to urinate and bladder scan revealed >56700ml in bladder, pts daughters concern for increased confusion, and an increase in swelling to left lower extremity. A foley, a MRI, and a duplex doppler order was placed.

## 2016-08-07 NOTE — Evaluation (Signed)
Occupational Therapy Evaluation Patient Details Name: Patricia Hopkins MRN: [id number] DOB: Feb 13, 1932 Today's Date: 08/07/2016    History of Present Illness pt is an 83 yo female admitted on 08/01/16 through [facility] ED for AMS with diagnosis of UTI and right hip fx. Pt was seen in ED on 07/16/16 and no fracture was seen on CT or Xray. pt underwent an attempted ORIF on 08/03/16 which was unsuccessful and underwent an IM nail on 08/06/16. PMH significant for HTN, Parkinson's disease, Bells Palsey with chronic right facial droop.    Clinical Impression   Since fall, pt has required assist for all ADL. Currently pt max assist +2 for stand pivot transfers and ADL. Pt with difficulty maintaining PWB 30% on RLE during functional activities despite max cues. Recommending SNF for follow up to maximize independence and safety with ADL and functional mobility prior to return home. All further OT needs can be met at the next venue of care; will defer OT to SNF. Please re-consult if needs change. Thank you for this referral.    Follow Up Recommendations  SNF;Supervision/Assistance - 24 hour    Equipment Recommendations  None recommended by OT    Recommendations for Other Services       Precautions / Restrictions Precautions Precautions: Fall Precaution Comments: s/p fall resulting in current fracture Restrictions Weight Bearing Restrictions: Yes RLE Weight Bearing: Partial weight bearing RLE Partial Weight Bearing Percentage or Pounds: 30 Other Position/Activity Restrictions: No active ABDUCTION      Mobility Bed Mobility Overal bed mobility: Needs Assistance Bed Mobility: Sit to Supine       Sit to supine: Max assist;+2 for physical assistance   General bed mobility comments: Cues for sequencing and technique. HOB flat without use of bed rail  Transfers Overall transfer level: Needs assistance Equipment used: 2 person hand held assist Transfers: Sit to/from [employer] Sit to Stand: Max assist;+2 physical assistance Stand pivot transfers: Max assist;+2 physical assistance       General transfer comment: Max +2 to boost up from chair x1, BSC x1 and perform stand pivot. Cues for hand placement, technique, and maintaining PWB on RLE.    Balance Overall balance assessment: Needs assistance;History of Falls Sitting-balance support: Bilateral upper extremity supported;Feet supported Sitting balance-Leahy Scale: Poor     Standing balance support: Bilateral upper extremity supported Standing balance-Leahy Scale: Zero                              ADL Overall ADL's : Needs assistance/impaired   Eating/Feeding Details (indicate cue type and reason): Daughter reports pt refusing to self feed Grooming: Set up;Min guard;Sitting;Wash/dry hands;Cueing for sequencing Grooming Details (indicate cue type and reason): Max cues to use both hands Upper Body Bathing: Maximal assistance;Sitting   Lower Body Bathing: Maximal assistance;Sit to/from stand;+2 for physical assistance   Upper Body Dressing : Maximal assistance;Sitting   Lower Body Dressing: Maximal assistance;+2 for physical assistance;Sit to/from stand   Toilet Transfer: Maximal assistance;+2 for physical assistance;Stand-pivot;BSC   Toileting- Clothing Manipulation and Hygiene: Maximal assistance;+2 for physical assistance;Sit to/from stand       Functional mobility during ADLs: Maximal assistance;+2 for physical assistance (for stand pivot only)       Vision     Perception     Praxis      Pertinent Vitals/Pain Pain Assessment: Faces Faces Pain Scale: Hurts even more Pain Location: RLE Pain Descriptors / Indicators: Grimacing;Aching;Sharp;Shooting Pain  Intervention(s): Monitored during session;Repositioned;Ice applied     Hand Dominance Right   Extremity/Trunk Assessment Upper Extremity Assessment Upper Extremity Assessment: Generalized weakness   Lower  Extremity Assessment Lower Extremity Assessment: Defer to PT evaluation   Cervical / Trunk Assessment Cervical / Trunk Assessment: Kyphotic   Communication Communication Communication: No difficulties   Cognition Arousal/Alertness: Awake/alert Behavior During Therapy: WFL for tasks assessed/performed Overall Cognitive Status: Impaired/Different from baseline Area of Impairment: Memory;Following commands;Safety/judgement;Problem solving     Memory: Decreased recall of precautions;Decreased short-term memory Following Commands: Follows one step commands consistently Safety/Judgement: Decreased awareness of safety;Decreased awareness of deficits   Problem Solving: Decreased initiation;Difficulty sequencing;Requires verbal cues;Requires tactile cues General Comments: Repeating herself a lot and difficulty following directions for functional tasks   General Comments       Exercises       Shoulder Instructions      Home Living Family/patient expects to be discharged to:: Skilled nursing facility Living Arrangements: Alone                               Additional Comments: was living in an independent living prior to fall and fracture      Prior Functioning/Environment Level of Independence: Needs assistance  Gait / Transfers Assistance Needed: limited mobility since fall. daughter assisting with all transfers ADL's / Homemaking Assistance Needed: since fall daughter has been assisting with all ADL   Comments: prior to fall was independent with ADL and used a RW for mobility        OT Problem List:     OT Treatment/Interventions:      OT Goals(Current goals can be found in the care plan section) Acute Rehab OT Goals Patient Stated Goal: rehab before home OT Goal Formulation: All assessment and education complete, DC therapy  OT Frequency:     Barriers to D/C:            Co-evaluation PT/OT/SLP Co-Evaluation/Treatment: Yes Reason for Co-Treatment:  For patient/therapist safety;To address functional/ADL transfers   OT goals addressed during session: ADL's and self-care      End of Session Equipment Utilized During Treatment: Gait belt Nurse Communication: Mobility status;Other (comment) (pt unable to void on [facility] )  Activity Tolerance: Patient tolerated treatment well Patient left: in bed;with call bell/phone within reach;with family/visitor present   Time: 2091-9802 OT Time Calculation (min): 30 min Charges:  OT General Charges $OT Visit: 1 Procedure OT Evaluation $OT Eval Moderate Complexity: 1 Procedure G-Codes:     Binnie Kand M.S., OTR/L Pager: [phone]  08/07/2016, 2:57 PM

## 2016-08-07 NOTE — Evaluation (Signed)
Physical Therapy Evaluation Patient Details Name: Patricia Hopkins MRN: [id number] DOB: [birth year] Today's Date: 08/07/2016   History of Present Illness  pt is an 83 yo female admitted on 08/01/16 through [facility] ED for AMS with diagnosis of UTI and right hip fx. Pt was seen in ED on 07/16/16 and no fracture was seen on CT or Xray. pt underwent an attempted ORIF on 08/03/16 which was unsuccessful and underwent an IM nail on 08/06/16. PMH significant for HTN, Parkinson's disease, Bells Palsey with chronic right facial droop.   Clinical Impression  Pt is POD 1 following the above procedure. Prior to fall and admission, pt was living alone in an ILF and independent. Prior to most recent admission, pt was staying with her daughter. Pt presents with increased stiffness and poor trunk control related to PD and has a tendency to posteriorly lean with any transfer or sitting activity. Pt will benefit from SNF placement upon discharge for short term rehab in order to maximize her functional outcomes. Pt continues to require acute PT services in order to assist with a smooth transition to next venue of care.     Follow Up Recommendations SNF;Supervision/Assistance - 24 hour    Equipment Recommendations  None recommended by PT (defer to next venue of care)    Recommendations for Other Services       Precautions / Restrictions Precautions Precautions: Fall Precaution Comments: s/p fall resulting in current fracture Restrictions Weight Bearing Restrictions: Yes RLE Weight Bearing: Partial weight bearing RLE Partial Weight Bearing Percentage or Pounds: 30 Other Position/Activity Restrictions: No active ABDUCTION      Mobility  Bed Mobility Overal bed mobility: Needs Assistance Bed Mobility: Supine to Sit     Supine to sit: Max assist;+2 for physical assistance;HOB elevated     General bed mobility comments: MAx A to scoot to EOB and to position trunk upright at side of bed.   Transfers Overall  transfer level: Needs assistance Equipment used: Rolling walker (2 wheeled) Transfers: Sit to/from [employer] Sit to Stand: Max assist;+2 physical assistance;+2 safety/equipment Stand pivot transfers: Max assist;+2 physical assistance;+2 safety/equipment;From elevated surface       General transfer comment: Max A to maintain 30% weight bearing through RLE and to assist with sit to stand and pivot transfer to recliner  Ambulation/Gait             General Gait Details: Not appropriate to assess this session  Stairs            Wheelchair Mobility    Modified Rankin (Stroke Patients Only)       Balance Overall balance assessment: Needs assistance;History of Falls Sitting-balance support: Bilateral upper extremity supported;Feet supported Sitting balance-Leahy Scale: Poor Sitting balance - Comments: Posterior trunk lean in sitting, requires assistance to maintain upright posture Postural control: Posterior lean Standing balance support: Bilateral upper extremity supported Standing balance-Leahy Scale: Zero Standing balance comment: Requries assistance to stand                              Pertinent Vitals/Pain Pain Assessment: Faces Faces Pain Scale: Hurts little more Pain Location: right LE under knee  Pain Descriptors / Indicators: Grimacing;Guarding Pain Intervention(s): Monitored during session;Premedicated before session;Limited activity within patient's tolerance;Repositioned    Home Living Family/patient expects to be discharged to:: Skilled nursing facility Living Arrangements: Alone               Additional Comments:  was living in an independent living prior to fall and fracture    Prior Function Level of Independence: Independent with assistive device(s)         Comments: was able to perfrom own self care and gait with RW prior to fall     Hand Dominance   Dominant Hand: Right    Extremity/Trunk Assessment    Upper Extremity Assessment Upper Extremity Assessment: Defer to OT evaluation    Lower Extremity Assessment Lower Extremity Assessment: RLE deficits/detail RLE Deficits / Details: pt requires assistance to perform all mobility with RLE. Limited due to stiffness from parkinson's     Cervical / Trunk Assessment Cervical / Trunk Assessment: Kyphotic  Communication   Communication: No difficulties  Cognition Arousal/Alertness: Awake/alert Behavior During Therapy: WFL for tasks assessed/performed Overall Cognitive Status: Within Functional Limits for tasks assessed                 General Comments: Pt oriented to person, place and situation.    General Comments General comments (skin integrity, edema, etc.): Daughter is present throughout session but is very sleepy and falling asleep throughout session.     Exercises     Assessment/Plan    PT Assessment Patient needs continued PT services  PT Problem List Decreased strength;Decreased range of motion;Decreased activity tolerance;Decreased balance;Decreased mobility;Decreased coordination;Decreased knowledge of use of DME;Pain          PT Treatment Interventions Gait training;Stair training;Functional mobility training;Therapeutic activities;Therapeutic exercise;Balance training;Patient/family education    PT Goals (Current goals can be found in the Care Plan section)  Acute Rehab PT Goals Patient Stated Goal: to get better PT Goal Formulation: With patient Time For Goal Achievement: 08/14/16 Potential to Achieve Goals: Fair    Frequency Min 3X/week   Barriers to discharge Decreased caregiver support Will require 24hr cg assistance at discharge    Co-evaluation               End of Session Equipment Utilized During Treatment: Gait belt Activity Tolerance: Patient limited by fatigue Patient left: in chair;with call bell/phone within reach;with family/visitor present Nurse Communication: Mobility status          Time: [id number] PT Time Calculation (min) (ACUTE ONLY): 30 min   Charges:   PT Evaluation $PT Eval Moderate Complexity: 1 Procedure PT Treatments $Therapeutic Activity: 8-22 mins   PT G Codes:        Colin BroachSabra M. [name] PT, DPT  [phone]  08/07/2016, 10:42 AM

## 2016-08-07 NOTE — Progress Notes (Signed)
Pt daughter reported more confusion. She had recent CT head 1/28 which showed no acute findings. Will order MRI brian today.  Manson Passeylma [facility] [phone]

## 2016-08-07 NOTE — Progress Notes (Signed)
Foam dressing removed from sacrum and a DTI found on sacrum. PU was present on admission. Wound c/s placed. A new foam dressing was placed. Will continue to turn Q2hrs and monitor.

## 2016-08-07 NOTE — Progress Notes (Signed)
Physical Therapy Treatment Patient Details Name: Patricia Hopkins MRN: [id number] DOB: 1931/12/24 Today's Date: 08/07/2016    History of Present Illness pt is an 83 yo female admitted on 08/01/16 through [facility] ED for AMS with diagnosis of UTI and right hip fx. Pt was seen in ED on 07/16/16 and no fracture was seen on CT or Xray. pt underwent an attempted ORIF on 08/03/16 which was unsuccessful and underwent an IM nail on 08/06/16. PMH significant for HTN, Parkinson's disease, Bells Palsey with chronic right facial droop.     PT Comments    Pt seen in conjunction with OT in order to address transfers and bed mobs. Pt continues to require max A for transfers and bed mobility with two person assist. Pt continues to benefit from SNF in order to maximize functional outcomes.    Follow Up Recommendations  SNF;Supervision/Assistance - 24 hour     Equipment Recommendations  None recommended by PT (defer to next venue of care)    Recommendations for Other Services       Precautions / Restrictions Precautions Precautions: Fall Precaution Comments: s/p fall resulting in current fracture Restrictions Weight Bearing Restrictions: Yes RLE Weight Bearing: Partial weight bearing RLE Partial Weight Bearing Percentage or Pounds: 30 Other Position/Activity Restrictions: No active ABDUCTION    Mobility  Bed Mobility Overal bed mobility: Needs Assistance Bed Mobility: Sit to Supine     Supine to sit: Max assist;+2 for physical assistance;HOB elevated Sit to supine: Max assist;+2 for physical assistance   General bed mobility comments: Cues for sequencing and technique. HOB flat without use of bed rail  Transfers Overall transfer level: Needs assistance Equipment used: 2 person hand held assist Transfers: Sit to/from [employer] Sit to Stand: Max assist;+2 physical assistance Stand pivot transfers: Max assist;+2 physical assistance       General transfer comment: Max +2 to  boost up from chair x1, BSC x1 and perform stand pivot. Cues for hand placement, technique, and maintaining PWB on RLE.  Ambulation/Gait                 Stairs            Wheelchair Mobility    Modified Rankin (Stroke Patients Only)       Balance Overall balance assessment: Needs assistance;History of Falls Sitting-balance support: Bilateral upper extremity supported;Feet supported Sitting balance-Leahy Scale: Poor     Standing balance support: Bilateral upper extremity supported Standing balance-Leahy Scale: Zero                      Cognition Arousal/Alertness: Awake/alert Behavior During Therapy: WFL for tasks assessed/performed Overall Cognitive Status: Impaired/Different from baseline Area of Impairment: Memory;Following commands;Safety/judgement;Problem solving     Memory: Decreased recall of precautions;Decreased short-term memory Following Commands: Follows one step commands consistently Safety/Judgement: Decreased awareness of safety;Decreased awareness of deficits   Problem Solving: Decreased initiation;Difficulty sequencing;Requires verbal cues;Requires tactile cues General Comments: Repeating herself a lot and difficulty following directions for functional tasks    Exercises      General Comments        Pertinent Vitals/Pain Pain Assessment: Faces Faces Pain Scale: Hurts even more Pain Location: RLE Pain Descriptors / Indicators: Grimacing;Aching;Sharp;Shooting Pain Intervention(s): Monitored during session;Repositioned;Ice applied    Home Living Family/patient expects to be discharged to:: Skilled nursing facility Living Arrangements: Alone             Additional Comments: was living in an independent living prior to  fall and fracture    Prior Function Level of Independence: Needs assistance  Gait / Transfers Assistance Needed: limited mobility since fall. daughter assisting with all transfers ADL's / Homemaking  Assistance Needed: since fall daughter has been assisting with all ADL Comments: prior to fall was independent with ADL and used a RW for mobility   PT Goals (current goals can now be found in the care plan section) Acute Rehab PT Goals Patient Stated Goal: rehab before home Progress towards PT goals: Progressing toward goals    Frequency    Min 3X/week      PT Plan Current plan remains appropriate    Co-evaluation PT/OT/SLP Co-Evaluation/Treatment: Yes Reason for Co-Treatment: For patient/therapist safety PT goals addressed during session: Mobility/safety with mobility OT goals addressed during session: ADL's and self-care     End of Session Equipment Utilized During Treatment: Gait belt Activity Tolerance: Patient limited by fatigue Patient left: in chair;with call bell/phone within reach;with family/visitor present     Time: [id number] PT Time Calculation (min) (ACUTE ONLY): 29 min  Charges:  $Therapeutic Activity: 8-22 mins                    G Codes:      Patricia Hopkins PT, DPT  [phone]  08/07/2016, 4:05 PM

## 2016-08-07 NOTE — Progress Notes (Addendum)
Patient ID: Patricia Hopkins, female   DOB: July 02, 1932, 83 y.o.   MRN: [id number]  PROGRESS NOTE    Patricia Hopkins  XLK:[id number] DOB: July 02, 1932 DOA: 08/01/2016  PCP: Frederich ChickWEBB, CAROL D, MD   Brief Narrative:  83 y.o.female with a history of Parkinson disease, asthma, Bell's palsy, essential hypertension, osteoporosis who presented to ED status post fall and right hip fracture. She underwent closed reduction and attempted open reduction of hip fracture 1/30 but per ortho needs another surgery today as well.   Assessment & Plan:   Delirium - Likely from history of Parkinson's disease - Ammonia normal - CT head no acute intracranial process.  - Chest x ray negative, urine culture no growth - Mental status better this am  Acute blood loss anemia - Hb drop to 8.9 from 11. Hb on admission at 13 - Held Lovenox - Hemoglobin 8.9, 8.7  Acute right hip fracture - Underwent attempt of IM nail 1-30, procedure aborted due to callus and bones findings.  - [facility] - Underwent right femoral intramedullary nail - Per PT - SNF recommended  Acute respiratory failure with hypoxia - No acute cardiopulmonary findings on CXR - Stable resp status  Slurred speech - CT head negative - Stabl  Moderate protein calorie malnutrition - In the context of acute illness - Seen by dietician    Parkinson disease - Continue Sinemet - Continue Lexapro  Chronic diastolic heart failure - Last EF of 60-65% on 11/26/2015 with grade 1 diastolic dysfunction. - Continue daily weight - LLE more swollen than right so we will get lower extremity doppler to rule out DVT  Hyponatremia - Likely secondary to dehydration.  Essential hypertension - BP stable, holding losartan-hydrochlorothiazide   Asthma - Not in acute exacerbation - Stable   Bells palsy - Chronic right facial droop.    DVT prophylaxis: SCD's bilaterally (was on Lovenox but this was stopped due to  drop in hemoglobin) Code Status: DNR/DNI  Family Communication: daughter at the bedside this am Disposition Plan: to SNF likely Monday    Consultants:   Orthopedic surgery   Procedures:   Closed reduction of right hip and attempted open reduction of the right hip fracture 08/03/2016  Right femoral intramedullary nail 08/06/2016   Antimicrobials:   None    Subjective: No overnight events.  Objective: Vitals:   08/06/16 2218 08/07/16 0440 08/07/16 0700 08/07/16 1444  BP: (!) 109/56 (!) 102/54  111/66  Pulse: 76 78  97  Resp:    18  Temp: 97.4 F (36.3 C) 97.9 F (36.6 C)  98.9 F (37.2 C)  TempSrc: Oral Oral  Oral  SpO2: 92% 96%  98%  Weight:   55.3 kg (122 lb)   Height:        Intake/Output Summary (Last 24 hours) at 08/07/16 1458 Last data filed at 08/07/16 1445  Gross per 24 hour  Intake             3040 ml  Output             1025 ml  Net             2015 ml   Filed Weights   08/01/16 1700 08/06/16 0626 08/07/16 0700  Weight: 45.4 kg (100 lb) 51.1 kg (112 lb 10.5 oz) 55.3 kg (122 lb)    Examination:  General exam: Appears calm and comfortable, no distress  Respiratory system: No wheezing, no rhonchi  Cardiovascular system: S1 &  S2 heard, RRR Gastrointestinal system: (+) BS, non tender  Central nervous system: right facial droop chronic Extremities: No swelling, palpable pulses bilaterally  Skin: warm, dry Psychiatry: Normal mood and behavior   Data Reviewed: I have personally reviewed following labs and imaging studies  CBC:  Recent Labs Lab 08/01/16 1300 08/02/16 0428 08/05/16 0446 08/06/16 1537 08/06/16 2301 08/07/16 0916  WBC 12.7* 9.5 9.8 10.7* 16.3* 11.5*  NEUTROABS 10.7*  --   --   --   --   --   HGB 13.4 11.8* 8.9* 9.7* 8.7* 8.7*  HCT 39.5 35.4* 25.7* 29.3* 25.6* 26.8*  MCV 89.8 90.5 90.2 91.8 92.1 91.8  PLT 476* 392 277 319 290 383   Basic Metabolic Panel:  Recent Labs Lab 08/01/16 1300 08/02/16 0428 08/04/16 1456  08/05/16 0446 08/06/16 2301 08/07/16 0916  NA 131* 136 131* 133*  --  133*  K 3.6 3.4* 3.4* 4.1  --  5.1  CL 95* 104 98* 102  --  97*  CO2 23 23 24 23   --  23  GLUCOSE 80 81 114* 87  --  92  BUN 27* 23* 14 16  --  13  CREATININE 0.52 0.42* 0.65 0.46 0.38* 0.43*  CALCIUM 8.7* 8.1* 7.9* 7.8*  --  8.1*   GFR: Estimated Creatinine Clearance: 41.9 mL/min (by C-G formula based on SCr of 0.43 mg/dL (L)). Liver Function Tests:  Recent Labs Lab 08/01/16 1300 08/02/16 0428  AST 24 21  ALT 14 <5*  ALKPHOS 151* 130*  BILITOT 1.0 1.2  PROT 6.5 5.8*  ALBUMIN 3.5 3.2*   No results for input(s): LIPASE, AMYLASE in the last 168 hours.  Recent Labs Lab 08/01/16 1309  AMMONIA 22   Coagulation Profile:  Recent Labs Lab 08/01/16 1300  INR 0.98   Cardiac Enzymes: No results for input(s): CKTOTAL, CKMB, CKMBINDEX, TROPONINI in the last 168 hours. BNP (last 3 results) No results for input(s): PROBNP in the last 8760 hours. HbA1C: No results for input(s): HGBA1C in the last 72 hours. CBG: No results for input(s): GLUCAP in the last 168 hours. Lipid Profile: No results for input(s): CHOL, HDL, LDLCALC, TRIG, CHOLHDL, LDLDIRECT in the last 72 hours. Thyroid Function Tests: No results for input(s): TSH, T4TOTAL, FREET4, T3FREE, THYROIDAB in the last 72 hours. Anemia Panel: No results for input(s): VITAMINB12, FOLATE, FERRITIN, TIBC, IRON, RETICCTPCT in the last 72 hours. Urine analysis:    Component Value Date/Time   COLORURINE AMBER (A) 08/01/2016 1300   APPEARANCEUR CLEAR 08/01/2016 1300   LABSPEC 1.033 (H) 08/01/2016 1300   PHURINE 5.0 08/01/2016 1300   GLUCOSEU NEGATIVE 08/01/2016 1300   HGBUR NEGATIVE 08/01/2016 1300   BILIRUBINUR NEGATIVE 08/01/2016 1300   KETONESUR 20 (A) 08/01/2016 1300   PROTEINUR NEGATIVE 08/01/2016 1300   NITRITE NEGATIVE 08/01/2016 1300   LEUKOCYTESUR NEGATIVE 08/01/2016 1300   Sepsis  Labs: @LABRCNTIP (procalcitonin:4,lacticidven:4)   Culture, blood (Routine x 2)     Status: None (Preliminary result)   Collection Time: 08/01/16 12:56 PM  Result Value Ref Range Status   Specimen Description BLOOD LEFT ANTECUBITAL  Final   Special Requests BOTTLES DRAWN AEROBIC AND ANAEROBIC 5CC EACH  Final   Culture   Final    NO GROWTH 4 DAYS Performed at [facility] [place] [facility], [address] [place], [place] [address]    Report Status PENDING  Incomplete  Urine culture     Status: None   Collection Time: 08/01/16  1:00 PM  Result Value  Ref Range Status   Specimen Description URINE, CLEAN CATCH  Final   Special Requests NONE  Final   Culture   Final    NO GROWTH Performed at [facility], [address] [place], [place] [address]    Report Status 08/02/2016 FINAL  Final  Culture, blood (Routine x 2)     Status: None (Preliminary result)   Collection Time: 08/01/16  2:00 PM  Result Value Ref Range Status   Specimen Description BLOOD RIGHT ARM  Final   Special Requests NONE  Final   Culture   Final    NO GROWTH 4 DAYS Performed at [facility], [address] [place], [place] [address]    Report Status PENDING  Incomplete  Surgical PCR screen     Status: None   Collection Time: 08/03/16  4:53 PM  Result Value Ref Range Status   MRSA, PCR NEGATIVE NEGATIVE Final   Staphylococcus aureus NEGATIVE NEGATIVE Final      Radiology Studies: Dg C-arm 1-60 Min-no Report Result Date: 08/03/2016 Fluoroscopy was utilized by the requesting physician.  No radiographic interpretation.   Dg Hip Operative Unilat W Or W/o Pelvis Right Result Date: 08/03/2016 The right femoral fracture could not be fully reduced for internal fixation at this time.    Dg Hip Unilat With Pelvis 2-3 Views Right Result Date: 08/03/2016 Acute right intertrochanteric hip fracture with angulation deformity. 2.  Old fractures left pubis. 3. Peripheral vascular disease . Electronically Signed   By:  Maisie Fus  Register   On: 08/03/2016 10:35      Scheduled Meds: . carbidopa-levodopa  1 tablet Oral TID  . enoxaparin (LOVENOX) injection  30 mg Subcutaneous Q24H  . senna-docusate  1 tablet Oral BID   Continuous Infusions:   LOS: 4 days    Time spent: 25 minutes Greater than 50% of the time spent on counseling and coordinating the care.   Manson Passey, MD [facility] Pager [phone]  If 7PM-7AM, please contact night-coverage [email or web address] Password [email or web address] 08/07/2016, 2:58 PM

## 2016-08-08 ENCOUNTER — Inpatient Hospital Stay (HOSPITAL_COMMUNITY): Payer: Medicare Other

## 2016-08-08 DIAGNOSIS — L8990 Pressure ulcer of unspecified site, unspecified stage: Secondary | ICD-10-CM | POA: Insufficient documentation

## 2016-08-08 DIAGNOSIS — D62 Acute posthemorrhagic anemia: Secondary | ICD-10-CM

## 2016-08-08 DIAGNOSIS — I10 Essential (primary) hypertension: Secondary | ICD-10-CM

## 2016-08-08 DIAGNOSIS — R609 Edema, unspecified: Secondary | ICD-10-CM

## 2016-08-08 LAB — RETICULOCYTES
RBC.: 2.48 MIL/uL — AB (ref 3.87–5.11)
Retic Count, Absolute: 131.4 10*3/uL (ref 19.0–186.0)
Retic Ct Pct: 5.3 % — ABNORMAL HIGH (ref 0.4–3.1)

## 2016-08-08 LAB — GLUCOSE, CAPILLARY: GLUCOSE-CAPILLARY: 93 mg/dL (ref 65–99)

## 2016-08-08 LAB — FOLATE: FOLATE: 5.7 ng/mL — AB (ref >5.9)

## 2016-08-08 LAB — HEMOGLOBIN AND HEMATOCRIT, BLOOD
HEMATOCRIT: 22.4 % — AB (ref 36.0–46.0)
HEMOGLOBIN: 7.6 g/dL — AB (ref 12.0–15.0)

## 2016-08-08 LAB — BASIC METABOLIC PANEL
Anion gap: 8 (ref 5–15)
BUN: 17 mg/dL (ref 6–20)
CALCIUM: 8 mg/dL — AB (ref 8.9–10.3)
CHLORIDE: 99 mmol/L — AB (ref 101–111)
CO2: 27 mmol/L (ref 22–32)
CREATININE: 0.4 mg/dL — AB (ref 0.44–1.00)
GFR calc Af Amer: >60 mL/min (ref >60)
GFR calc non Af Amer: >60 mL/min (ref >60)
Glucose, Bld: 83 mg/dL (ref 65–99)
Potassium: 3.7 mmol/L (ref 3.5–5.1)
Sodium: 134 mmol/L — ABNORMAL LOW (ref 135–145)

## 2016-08-08 LAB — FERRITIN: Ferritin: 312 ng/mL — ABNORMAL HIGH (ref 11–307)

## 2016-08-08 LAB — CBC
HEMATOCRIT: 21.7 % — AB (ref 36.0–46.0)
Hemoglobin: 7.1 g/dL — ABNORMAL LOW (ref 12.0–15.0)
MCH: 29.8 pg (ref 26.0–34.0)
MCHC: 32.7 g/dL (ref 30.0–36.0)
MCV: 91.2 fL (ref 78.0–100.0)
Platelets: 299 10*3/uL (ref 150–400)
RBC: 2.38 MIL/uL — ABNORMAL LOW (ref 3.87–5.11)
RDW: 14.7 % (ref 11.5–15.5)
WBC: 6.7 10*3/uL (ref 4.0–10.5)

## 2016-08-08 LAB — IRON AND TIBC
Iron: 35 ug/dL (ref 28–170)
SATURATION RATIOS: 17 % (ref 10.4–31.8)
TIBC: 202 ug/dL — AB (ref 250–450)
UIBC: 167 ug/dL

## 2016-08-08 LAB — VITAMIN B12: VITAMIN B 12: 403 pg/mL (ref 180–914)

## 2016-08-08 MED ORDER — MORPHINE SULFATE 15 MG PO TABS
7.5000 mg | ORAL_TABLET | ORAL | Status: DC | PRN
  Filled 2016-08-08: qty 1

## 2016-08-08 NOTE — Progress Notes (Signed)
PROGRESS NOTE    Patricia Hopkins  ZHY:[id number] DOB: 05-23-1932 DOA: 08/01/2016 PCP: Frederich Chick, MD    Brief Narrative:  83 y.o.female with a history of Parkinson disease, asthma, Bell's palsy, essential hypertension, osteoporosis who presented to ED status post fall and right hip fracture. She underwent closed reduction and attempted open reduction of hip fracture 1/30 but per ortho needs another surgery today as well.     Assessment & Plan:   Active Problems:   Altered mental state   Asthma   Essential hypertension   Osteoporosis   Bell's palsy   Parkinson disease (HCC)   Closed right hip fracture (HCC)   Malnutrition of moderate degree   Acute delirium   Closed pertrochanteric fracture of femur with malunion, right   Pressure injury of skin   Postoperative anemia due to acute blood loss  Delirium - Likely from history of Parkinson's disease - Ammonia normal - CT head no acute intracranial process. MRI head negative. - Chest x ray negative, urine culture no growth - No signs of acute infection. - Mental status improving.   Acute blood loss anemia/postop acute blood loss anemia - Hb drop to 7.1 from 11. Hb on admission at 13 - Held Lovenox - Hemoglobin 7.1 from 8.7 from 8.9. - Repeat hemoglobin in the morning if hemoglobin less than 7 transfuse packed red blood cells.  Acute right hip fracture - Underwent attempt of IM nail 1-30, procedure aborted due to callus and bones findings.  - Transferred to [facility] 2/1 for surgery - Underwent right femoral intramedullary nail - Per PT - SNF recommended  Acute respiratory failure with hypoxia - No acute cardiopulmonary findings on CXR - Stable resp status  Slurred speech - CT head negative, MRI head negative. - Stable  Moderate protein calorie malnutrition - In the context of acute illness - Seen by dietician    Parkinson disease - Continue Sinemet - Continue Lexapro  Chronic diastolic heart  failure - Last EF of 60-65% on 11/26/2015 with grade 1 diastolic dysfunction. - Continue daily weight - LLE more swollen than right. Lower extremity Dopplers negative for DVT.   Hyponatremia - Likely secondary to dehydration. Sodium levels improving with hydration. Will likely not resume HCTZ on discharge.  Essential hypertension - BP stable, to hypertensive medications on hold.   Asthma - Not in acute exacerbation - Stable   Bells palsy - Chronic right facial droop.    DVT prophylaxis: Lovenox Code Status: DO NOT RESUSCITATE Family Communication: Updated patient and daughter at bedside. Disposition Plan: Skilled nursing facility once confusion has resolved, hemoglobin stable, per orthopedics.   Consultants:   Orthopedics: Dr. Aundria Rud 08/03/2016  Procedures:   CT head 08/01/2016  MRI brain 08/07/2016  xray of the pelvis 08/06/2016  Chest x-ray 08/01/2016  X-ray of the right hip and pelvis 08/03/2016  X-ray of the right femur 08/06/2016  Left lower extremity Dopplers 08/08/2016  Antimicrobials:   IV clindamycin 08/03/2016>>>> 08/07/2016   Subjective: Patient sitting up in bed. No chest pain. No shortness of breath. Getting fed.  Objective: Vitals:   08/07/16 2121 08/08/16 0454 08/08/16 1132 08/08/16 1500  BP: 113/62 110/65 (!) 111/51 (!) 120/57  Pulse: 78 88 83 89  Resp: 18  16 16   Temp: 97.3 F (36.3 C) 98.6 F (37 C) 98.2 F (36.8 C) 98.1 F (36.7 C)  TempSrc: Oral Oral    SpO2: 96% 97% 97% 98%  Weight:      Height:  Intake/Output Summary (Last 24 hours) at 08/08/16 1924 Last data filed at 08/08/16 1133  Gross per 24 hour  Intake               60 ml  Output              500 ml  Net             -440 ml   Filed Weights   08/01/16 1700 08/06/16 0626 08/07/16 0700  Weight: 45.4 kg (100 lb) 51.1 kg (112 lb 10.5 oz) 55.3 kg (122 lb)    Examination:  General exam: Appears calm and comfortable  Respiratory system: Clear to  auscultation anterior lung fields. Respiratory effort normal. Cardiovascular system: S1 & S2 heard, RRR. No JVD, murmurs, rubs, gallops or clicks. No pedal edema. Gastrointestinal system: Abdomen is nondistended, soft and nontender. No organomegaly or masses felt. Normal bowel sounds heard. Central nervous system: Alert and oriented. No focal neurological deficits. Extremities: Symmetric 5 x 5 power. Skin: No rashes, lesions or ulcers Psychiatry: Judgement and insight appear normal. Mood & affect appropriate.     Data Reviewed: I have personally reviewed following labs and imaging studies  CBC:  Recent Labs Lab 08/05/16 0446 08/06/16 1537 08/06/16 2301 08/07/16 0916 08/08/16 0324 08/08/16 1610  WBC 9.8 10.7* 16.3* 11.5* 6.7  --   HGB 8.9* 9.7* 8.7* 8.7* 7.1* 7.6*  HCT 25.7* 29.3* 25.6* 26.8* 21.7* 22.4*  MCV 90.2 91.8 92.1 91.8 91.2  --   PLT 277 319 290 383 299  --    Basic Metabolic Panel:  Recent Labs Lab 08/02/16 0428 08/04/16 1456 08/05/16 0446 08/06/16 2301 08/07/16 0916 08/08/16 0324  NA 136 131* 133*  --  133* 134*  K 3.4* 3.4* 4.1  --  5.1 3.7  CL 104 98* 102  --  97* 99*  CO2 23 24 23   --  23 27  GLUCOSE 81 114* 87  --  92 83  BUN 23* 14 16  --  13 17  CREATININE 0.42* 0.65 0.46 0.38* 0.43* 0.40*  CALCIUM 8.1* 7.9* 7.8*  --  8.1* 8.0*   GFR: Estimated Creatinine Clearance: 41.9 mL/min (by C-G formula based on SCr of 0.4 mg/dL (L)). Liver Function Tests:  Recent Labs Lab 08/02/16 0428  AST 21  ALT <5*  ALKPHOS 130*  BILITOT 1.2  PROT 5.8*  ALBUMIN 3.2*   No results for input(s): LIPASE, AMYLASE in the last 168 hours. No results for input(s): AMMONIA in the last 168 hours. Coagulation Profile: No results for input(s): INR, PROTIME in the last 168 hours. Cardiac Enzymes: No results for input(s): CKTOTAL, CKMB, CKMBINDEX, TROPONINI in the last 168 hours. BNP (last 3 results) No results for input(s): PROBNP in the last 8760 hours. HbA1C: No  results for input(s): HGBA1C in the last 72 hours. CBG:  Recent Labs Lab 08/08/16 1129  GLUCAP 93   Lipid Profile: No results for input(s): CHOL, HDL, LDLCALC, TRIG, CHOLHDL, LDLDIRECT in the last 72 hours. Thyroid Function Tests: No results for input(s): TSH, T4TOTAL, FREET4, T3FREE, THYROIDAB in the last 72 hours. Anemia Panel:  Recent Labs  08/08/16 1033  VITAMINB12 403  FOLATE 5.7*  FERRITIN 312*  TIBC 202*  IRON 35  RETICCTPCT 5.3*   Sepsis Labs: No results for input(s): PROCALCITON, LATICACIDVEN in the last 168 hours.  Recent Results (from the past 240 hour(s))  Culture, blood (Routine x 2)     Status: None   Collection Time: 08/01/16  12:56 PM  Result Value Ref Range Status   Specimen Description BLOOD LEFT ANTECUBITAL  Final   Special Requests BOTTLES DRAWN AEROBIC AND ANAEROBIC 5CC EACH  Final   Culture   Final    NO GROWTH 5 DAYS Performed at [facility], [address], [place], [place] [address]    Report Status 08/06/2016 FINAL  Final  Urine culture     Status: None   Collection Time: 08/01/16  1:00 PM  Result Value Ref Range Status   Specimen Description URINE, CLEAN CATCH  Final   Special Requests NONE  Final   Culture   Final    NO GROWTH Performed at [facility], [address] [place], [place] [address]    Report Status 08/02/2016 FINAL  Final  Culture, blood (Routine x 2)     Status: None   Collection Time: 08/01/16  2:00 PM  Result Value Ref Range Status   Specimen Description BLOOD RIGHT ARM  Final   Special Requests NONE  Final   Culture   Final    NO GROWTH 5 DAYS Performed at [facility], [address], [place], [place] [address]    Report Status 08/06/2016 FINAL  Final  Surgical PCR screen     Status: None   Collection Time: 08/03/16  4:53 PM  Result Value Ref Range Status   MRSA, PCR NEGATIVE NEGATIVE Final   Staphylococcus aureus NEGATIVE NEGATIVE Final    Comment:        The Xpert SA Assay (FDA approved  for NASAL specimens in patients over 89 years of age), is one component of a comprehensive surveillance program.  Test performance has been validated by [facility] for patients greater than or equal to 29 year old. It is not intended to diagnose infection nor to guide or monitor treatment.          Radiology Studies: Mr Brain Wo Contrast  Result Date: 08/08/2016 CLINICAL DATA:  Increasing confusion since August 01, 2016. Recent hip pinning. History of hypertension and Parkinson's disease. EXAM: MRI HEAD WITHOUT CONTRAST TECHNIQUE: Multiplanar, multiecho pulse sequences of the brain and surrounding structures were obtained without intravenous contrast. COMPARISON:  CT HEAD August 01, 2016 FINDINGS: BRAIN: No reduced diffusion to suggest acute ischemia. No susceptibility artifact to suggest hemorrhage. The ventricles and sulci are normal for patient's age. Patchy to confluent supratentorial white matter FLAIR T2 hyperintensities. No suspicious parenchymal signal, masses or mass effect. No abnormal extra-axial fluid collections. VASCULAR: Normal major intracranial vascular flow voids present at skull base. SKULL AND UPPER CERVICAL SPINE: No abnormal sellar expansion. No suspicious calvarial bone marrow signal. Craniocervical junction maintained. SINUSES/ORBITS: Atretic maxillary sinuses. Trace mastoid effusions. Status post RIGHT ocular lens implant. The included ocular globes and orbital contents are non-suspicious. OTHER: Patient is edentulous. IMPRESSION: No acute intracranial process. Moderate chronic small vessel ischemic disease. Electronically Signed   By: Awilda Metro M.D.   On: 08/08/2016 01:07   Pelvis Portable  Result Date: 08/06/2016 CLINICAL DATA:  Right hip fracture EXAM: PORTABLE PELVIS 1-2 VIEWS COMPARISON:  08/03/2016 FINDINGS: Portable postoperative imaging demonstrates the proximal portion of an intramedullary right femoral nail with interlocking femoral neck fixation.  Good alignment on this single supine projection. Remote healed fracture deformities of the left pubic rami. IMPRESSION: ORIF with intramedullary right femoral nail Electronically Signed   By: Ellery Plunk M.D.   On: 08/06/2016 21:51   Dg C-arm Gt 120 Min  Result Date:  08/06/2016 CLINICAL DATA:  RIGHT hip intramedullary nail. EXAM: DG C-ARM GT 120 MIN; RIGHT FEMUR 2 VIEWS FLUOROSCOPY TIME:  Fluoroscopy Time:  3 minutes and 53 seconds Radiation Exposure Index (if provided by the fluoroscopic device): Not available Number of Acquired Spot Images: 5 COMPARISON:  Pelvic radiograph August 03, 2016 FINDINGS: Interpreting radiologist was not present at time of operation. Femur intramedullary rod and RIGHT femoral neck pinning. IMPRESSION: RIGHT femur ORIF. Electronically Signed   By: Awilda Metro M.D.   On: 08/06/2016 22:42   Dg Femur, Min 2 Views Right  Result Date: 08/06/2016 CLINICAL DATA:  RIGHT hip intramedullary nail. EXAM: DG C-ARM GT 120 MIN; RIGHT FEMUR 2 VIEWS FLUOROSCOPY TIME:  Fluoroscopy Time:  3 minutes and 53 seconds Radiation Exposure Index (if provided by the fluoroscopic device): Not available Number of Acquired Spot Images: 5 COMPARISON:  Pelvic radiograph August 03, 2016 FINDINGS: Interpreting radiologist was not present at time of operation. Femur intramedullary rod and RIGHT femoral neck pinning. IMPRESSION: RIGHT femur ORIF. Electronically Signed   By: Awilda Metro M.D.   On: 08/06/2016 22:42   Dg Femur [place] 2 Views Right  Result Date: 08/06/2016 CLINICAL DATA:  Right hip fracture EXAM: RIGHT FEMUR PORTABLE 2 VIEW COMPARISON:  08/03/2016 FINDINGS: Portable postoperative imaging demonstrates intramedullary right femoral nail with interlocking femoral neck fixation and with a single distal interlocking screw. The alignment. No immediate complication is evident. IMPRESSION: ORIF with intramedullary femoral nail fixation. Electronically Signed   By: Ellery Plunk M.D.    On: 08/06/2016 21:52        Scheduled Meds: . carbidopa-levodopa  1 tablet Oral TID  . enoxaparin (LOVENOX) injection  30 mg Subcutaneous Q24H  . senna-docusate  1 tablet Oral BID   Continuous Infusions:   LOS: 5 days    Time spent: 35 minutes    THOMPSON,DANIEL, MD [facility] Pager [phone]  If 7PM-7AM, please contact night-coverage [email or web address] Password [facility] 08/08/2016, 7:24 PM

## 2016-08-08 NOTE — Clinical Social Work Placement (Signed)
   CLINICAL SOCIAL WORK PLACEMENT  NOTE  Date:  08/08/2016  Patient Details  Name: Patricia Hopkins MRN: [id number] Date of Birth: 09-15-1931  Clinical Social Work is seeking post-discharge placement for this patient at the Skilled  Nursing Facility level of care (*CSW will initial, date and re-position this form in  chart as items are completed):      Patient/family provided with [facility] Clinical Social Work Department's list of facilities offering this level of care within the geographic area requested by the patient (or if unable, by the patient's family).      Patient/family informed of their freedom to choose among providers that offer the needed level of care, that participate in Medicare, Medicaid or managed care program needed by the patient, have an available bed and are willing to accept the patient.      Patient/family informed of [facility]'s ownership interest in [facility] and [facility], as well as of the fact that they are under no obligation to receive care at these facilities.  PASRR submitted to EDS on       PASRR number received on       Existing PASRR number confirmed on       FL2 transmitted to all facilities in geographic area requested by pt/family on       FL2 transmitted to all facilities within larger geographic area on       Patient informed that his/her managed care company has contracts with or will negotiate with certain facilities, including the following:            Patient/family informed of bed offers received.  Patient chooses bed at       Physician recommends and patient chooses bed at      Patient to be transferred to   on  .  Patient to be transferred to facility by       Patient family notified on   of transfer.  Name of family member notified:        PHYSICIAN Please prepare priority discharge summary, including medications, Please prepare prescriptions, Please sign FL2     Additional Comment:     _______________________________________________ Volney AmericanBridget A Mayton, LCSW 08/08/2016, 11:48 AM

## 2016-08-08 NOTE — Progress Notes (Signed)
VASCULAR LAB PRELIMINARY  PRELIMINARY  PRELIMINARY  PRELIMINARY  Left lower extremity venous duplex completed.    Preliminary report:  There is no DVT or SVT noted in the left lower extremity.  Martie Muhlbauer, RVT 08/08/2016, 12:09 PM

## 2016-08-08 NOTE — NC FL2 (Addendum)
[place] MEDICAID FL2 LEVEL OF CARE SCREENING TOOL     IDENTIFICATION  Patient Name: Patricia Hopkins Birthdate: [birth year] Sex: female Admission Date (Current Location): 08/01/2016  [facility] and [place] Number:  Producer, television/film/video and Address:  [facility], [address], [place], [place] [address]      Provider Number: [id number]  Attending Physician Name and Address:  Rodolph Bong, MD  Relative Name and Phone Number:       Current Level of Care: Hospital Recommended Level of Care: Skilled Nursing Facility Prior Approval Number:    Date Approved/Denied:   PASRR Number: [id number]    Discharge Plan: SNF    Current Diagnoses: Patient Active Problem List   Diagnosis Date Noted  . Closed pertrochanteric fracture of femur with malunion, right 08/06/2016  . Acute delirium 08/05/2016  . Malnutrition of moderate degree 08/04/2016  . Closed right hip fracture (HCC) 08/03/2016  . Altered mental state 08/01/2016  . Asthma 08/01/2016  . Essential hypertension 08/01/2016  . Osteoporosis 08/01/2016  . Bell's palsy 08/01/2016  . Parkinson disease (HCC) 08/01/2016    Orientation RESPIRATION BLADDER Height & Weight     Self  Normal Indwelling catheter, Incontinent (placed 2/3) Weight: 122 lb (55.3 kg) Height:  5' 1.5" (156.2 cm)  BEHAVIORAL SYMPTOMS/MOOD NEUROLOGICAL BOWEL NUTRITION STATUS      Continent  (Please see d/c summary)  AMBULATORY STATUS COMMUNICATION OF NEEDS Skin   Extensive Assist Verbally PU Stage and Appropriate Care, Surgical wounds (PU: Mid sacrum. Closed incision right hip, Silver hydrofiber dressing. Closed incision right thigh, Silver hydrofiber dressing.)                       Personal Care Assistance Level of Assistance  Bathing, Feeding, Dressing Bathing Assistance: Maximum assistance Feeding assistance: Limited assistance Dressing Assistance: Maximum assistance     Functional Limitations Info  Sight,  Hearing, Speech Sight Info: Adequate Hearing Info: Adequate Speech Info: Adequate    SPECIAL CARE FACTORS FREQUENCY  PT (By licensed PT), OT (By licensed OT)     PT Frequency: 3x week OT Frequency: 3x week            Contractures Contractures Info: Not present    Additional Factors Info  Code Status, Allergies Code Status Info: DNR Allergies Info: Fosamax Alendronate Sodium, Penicillins, Crestor Rosuvastatin, Pravachol Pravastatin Sodium, Zocor Simvastatin           Current Medications (08/08/2016):  This is the current hospital active medication list Current Facility-Administered Medications  Medication Dose Route Frequency Provider Last Rate Last Dose  . acetaminophen (TYLENOL) tablet 650 mg  650 mg Oral Q6H PRN Samson Frederic, MD   650 mg at 08/08/16 5621   Or  . acetaminophen (TYLENOL) suppository 650 mg  650 mg Rectal Q6H PRN Samson Frederic, MD      . carbidopa-levodopa (SINEMET IR) 25-100 MG per tablet immediate release 1 tablet  1 tablet Oral TID Narda Bonds, MD   1 tablet at 08/08/16 1014  . enoxaparin (LOVENOX) injection 30 mg  30 mg Subcutaneous Q24H Samson Frederic, MD   30 mg at 08/07/16 1345  . ipratropium-albuterol (DUONEB) 0.5-2.5 (3) MG/3ML nebulizer solution 3 mL  3 mL Nebulization Q6H PRN Alison Murray, MD      . menthol-cetylpyridinium (CEPACOL) lozenge 3 mg  1 lozenge Oral PRN Samson Frederic, MD       Or  . phenol (CHLORASEPTIC) mouth spray 1  spray  1 spray Mouth/Throat PRN Samson FredericBrian Swinteck, MD      . metoCLOPramide (REGLAN) tablet 5-10 mg  5-10 mg Oral Q8H PRN [name], MD   5 mg at 08/04/16 2200   Or  . metoCLOPramide (REGLAN) injection 5-10 mg  5-10 mg Intravenous Q8H PRN [name], MD      . morphine 2 MG/ML injection 0.5 mg  0.5 mg Intravenous Q4H PRN Belkys A Regalado, MD   0.5 mg at 08/08/16 0117  . ondansetron (ZOFRAN) tablet 4 mg  4 mg Oral Q6H PRN [name], MD       Or  . ondansetron [facility](ZOFRAN) injection 4 mg  4  mg Intravenous Q6H PRN [name], MD      . senna-docusate (Senokot-S) tablet 1 tablet  1 tablet Oral BID Alba CoryBelkys A Regalado, MD   1 tablet at 08/08/16 1014     Discharge Medications: Please see discharge summary for a list of discharge medications.  Relevant Imaging Results:  Relevant Lab Results:   Additional Information SSN: [id number]  Volney AmericanBridget A Mayton, LCSW

## 2016-08-08 NOTE — Progress Notes (Signed)
   Subjective: 2 Days Post-Op Procedure(s) (LRB): INTRAMEDULLARY (IM) NAIL FEMORAL (Right)  C/o mild to moderate pain in the right hip Denies any new issues or symptoms Patient reports pain as moderate.  Objective:   VITALS:   Vitals:   08/07/16 2121 08/08/16 0454  BP: 113/62 110/65  Pulse: 78 88  Resp: 18   Temp: 97.3 F (36.3 C) 98.6 F (37 C)    Right hip incision healing well nv intact distally No rashes or edema distally  LABS  Recent Labs  08/06/16 2301 08/07/16 0916 08/08/16 0324  HGB 8.7* 8.7* 7.1*  HCT 25.6* 26.8* 21.7*  WBC 16.3* 11.5* 6.7  PLT 290 383 299     Recent Labs  08/06/16 2301 08/07/16 0916 08/08/16 0324  NA  --  133* 134*  K  --  5.1 3.7  BUN  --  13 17  CREATININE 0.38* 0.43* 0.40*  GLUCOSE  --  92 83     Assessment/Plan: 2 Days Post-Op Procedure(s) (LRB): INTRAMEDULLARY (IM) NAIL FEMORAL (Right) Pt/Ot as able Pain management as needed D/c planning  Pulmonary toilet   Brad Loucinda Croy, MPAS, PA-C  08/08/2016, 8:25 AM

## 2016-08-08 NOTE — Clinical Social Work Note (Signed)
CSW explained to daughter that in order to get pt's PASRR number (required for SNF placement) she would need to provide pt's SSN. Pt's daughter reports she doesn't know it and the pt doesn't know it. Pt's daughter reports she would not know where to find the card. CSW expressed the importance of finding out this number.  [address]Dale Strausser [place], [place] [phone]

## 2016-08-08 NOTE — Clinical Social Work Note (Signed)
Clinical Social Work Assessment  Patient Details  Name: Patricia Hopkins MRN: [id number] Date of Birth: Sep 19, 1931  Date of referral:  08/08/16               Reason for consult:  Facility Placement                Permission sought to share information with:  Family Supports Permission granted to share information::  Yes, Verbal Permission Granted  Name::     Angie  Agency::     Relationship::  Daughter  Contact Information:  [phone]  Housing/Transportation Living arrangements for the past 2 months:   (Retirement home) Source of Information:  Adult Children, Patient Patient Interpreter Needed:  None Criminal Activity/Legal Involvement Pertinent to Current Situation/Hospitalization:  No - Comment as needed Significant Relationships:  Adult Children Lives with:  Self Do you feel safe going back to the place where you live?    Need for family participation in patient care:     Care giving concerns:  Pt's daughter Karoline Caldwellngie was present at bedside during initial assessment. Pt was asleep and did not wake up at the sound of CSW voice.   Social Worker assessment / plan:  CSW spoke with pt's daughter at bedside to complete initial assessment. Pt lives at a retirement home. Pt's daughter is understanding of the need for SNF at d/c. Pt's daughter is agreeable. Pt's daughter reports the MD recommended [facility] and the family prefers [facility]. Pt's daughter [place] for family to be able to visit. CSW will [facility] facilities and provide b/o once available.   Employment status:  Retired Database administratornsurance information:  Managed Medicare PT Recommendations:  Skilled Nursing Facility Information / Referral to community resources:  Skilled Nursing Facility  Patient/Family's Response to care:  Pt's daughter verbalized understanding of CSW role and expressed appreciation for support. Pt's daughter denies any concern regarding pt care at this  time.   Patient/Family's Understanding of and Emotional Response to Diagnosis, Current Treatment, and Prognosis:  Pt's daughter understanding and realistic regarding physical limitations. Pt's daughter understands the need for SNF placement at d/c. Pt's daughter agreeable to SNF placement at d/c, at this time.  Pt's daughter denies any concern regarding treatment plan at this time. CSW will continue to provide support and facilitate d/c needs.   Emotional Assessment Appearance:  Appears stated age Attitude/Demeanor/Rapport:  Unable to Assess Affect (typically observed):  Unable to Assess Orientation:  Oriented to Self, Oriented to Place, Oriented to  Time, Oriented to Situation Alcohol / Substance use:  Not Applicable Psych involvement (Current and /or in the community):  No (Comment)  Discharge Needs  Concerns to be addressed:  No discharge needs identified Readmission within the last 30 days:  No Current discharge risk:  Dependent with Mobility Barriers to Discharge:  Continued Medical Work up   [employer] IncBridget A Mayton, LCSW 08/08/2016, 11:28 AM

## 2016-08-09 ENCOUNTER — Encounter (HOSPITAL_COMMUNITY): Payer: Self-pay | Admitting: *Deleted

## 2016-08-09 DIAGNOSIS — D62 Acute posthemorrhagic anemia: Secondary | ICD-10-CM

## 2016-08-09 LAB — CBC
HEMATOCRIT: 22.5 % — AB (ref 36.0–46.0)
HEMOGLOBIN: 7.4 g/dL — AB (ref 12.0–15.0)
MCH: 30.3 pg (ref 26.0–34.0)
MCHC: 32.9 g/dL (ref 30.0–36.0)
MCV: 92.2 fL (ref 78.0–100.0)
Platelets: 280 10*3/uL (ref 150–400)
RBC: 2.44 MIL/uL — ABNORMAL LOW (ref 3.87–5.11)
RDW: 15.1 % (ref 11.5–15.5)
WBC: 6.9 10*3/uL (ref 4.0–10.5)

## 2016-08-09 LAB — BASIC METABOLIC PANEL
ANION GAP: 9 (ref 5–15)
BUN: 19 mg/dL (ref 6–20)
CHLORIDE: 98 mmol/L — AB (ref 101–111)
CO2: 26 mmol/L (ref 22–32)
Calcium: 7.8 mg/dL — ABNORMAL LOW (ref 8.9–10.3)
Creatinine, Ser: 0.38 mg/dL — ABNORMAL LOW (ref 0.44–1.00)
GFR calc non Af Amer: >60 mL/min (ref >60)
GLUCOSE: 84 mg/dL (ref 65–99)
Potassium: 3.8 mmol/L (ref 3.5–5.1)
Sodium: 133 mmol/L — ABNORMAL LOW (ref 135–145)

## 2016-08-09 MED ORDER — TRAMADOL-ACETAMINOPHEN 37.5-325 MG PO TABS: 1.0000 | ORAL_TABLET | Freq: Four times a day (QID) | ORAL | 0 refills | Status: DC | PRN

## 2016-08-09 MED ORDER — DOCUSATE SODIUM 100 MG PO CAPS
100.0000 mg | ORAL_CAPSULE | Freq: Two times a day (BID) | ORAL | Status: DC
  Administered 2016-08-09 – 2016-08-10 (×3): 100 mg via ORAL
  Filled 2016-08-09 (×2): qty 1

## 2016-08-09 MED ORDER — ENOXAPARIN SODIUM 30 MG/0.3ML ~~LOC~~ SOLN: 30.0000 mg | SUBCUTANEOUS | 0 refills | Status: AC

## 2016-08-09 NOTE — Progress Notes (Addendum)
Physical Therapy Treatment Patient Details Name: Philis NettleCharlotte F Higashi MRN: [id number] DOB: [birth year] Today's Date: 08/09/2016    History of Present Illness pt is an 83 yo female admitted on 08/01/16 through [facility] for AMS with diagnosis of UTI and right hip fx. Pt was seen in ED on 07/16/16 and no fracture was seen on CT or Xray. pt underwent an attempted ORIF on 08/03/16 which was unsuccessful and underwent an IM nail on 08/06/16. PMH significant for HTN, Parkinson's disease, Bells Palsey with chronic right facial droop.     PT Comments    Pt is making some progress with her mobility today.  Reporting more pain in her back (she is not used to being supine) and tongue, than her right leg.  Pt was able to actively assist in all aspects of her mobility today.  PT will continue to follow acutely to help progress safe mobility.    Follow Up Recommendations  SNF;Supervision/Assistance - 24 hour     Equipment Recommendations  Wheelchair (measurements PT);Wheelchair cushion (measurements PT);Hospital bed;Other (comment) (hoyer lift)    Recommendations for Other Services       Precautions / Restrictions Precautions Precautions: Fall Precaution Comments: s/p fall resulting in current fracture Restrictions Weight Bearing Restrictions: Yes RLE Weight Bearing: Partial weight bearing RLE Partial Weight Bearing Percentage or Pounds: 30 Other Position/Activity Restrictions: No active ABDUCTION    Mobility  Bed Mobility Overal bed mobility: Needs Assistance Bed Mobility: Supine to Sit;Sit to Supine;Rolling Rolling: Max assist   Supine to sit: Mod assist;HOB elevated Sit to supine: Max assist   General bed mobility comments: Max assist to roll, pt assisting with arms on bed rail once rolled to side, assist needed at trunk and to help progress legs over EOB, if given time and bed rail to pull from for leverage, pt is able to help with supine to sit transfer, less assist from pt to go to supine after  getting fatigued.  Transfers Overall transfer level: Needs assistance   Transfers: Sit to/from Stand Sit to Stand: Max assist         General transfer comment: Max assist to stand EOB, pt having significant difficulty keeping only 30% of her weight on her right leg.  We stood EOB twice and then pt fatigued so much that I used maxi move total lift to safely get her OOB to the recliner chair.   Ambulation/Gait             General Gait Details: unable at this time.    Stairs               Balance Overall balance assessment: Needs assistance Sitting-balance support: Feet supported;Bilateral upper extremity supported Sitting balance-Leahy Scale: Fair Sitting balance - Comments: supervision EOB, but poor endrance for sitting.  Pt very kyphotic at baseline and becomes more DOE EOB likely due to lung restrictions from posture and poor trunk musculature.  Postural control: Posterior lean Standing balance support: Bilateral upper extremity supported Standing balance-Leahy Scale: Zero Standing balance comment: When attempting to stand, pt tends to lean backwards.                     Cognition Arousal/Alertness: Awake/alert Behavior During Therapy: WFL for tasks assessed/performed Overall Cognitive Status: Impaired/Different from baseline Area of Impairment: Memory;Orientation;Problem solving Orientation Level: Time;Situation   Memory: Decreased recall of precautions Following Commands: Follows one step commands consistently;Follows one step commands with increased time Safety/Judgement: Decreased awareness of safety;Decreased awareness of deficits  Problem Solving: Decreased initiation;Difficulty sequencing;Requires verbal cues;Requires tactile cues General Comments: Tangental at times    Exercises Total Joint Exercises Ankle Circles/Pumps: AROM;AAROM;Both;20 reps Short Arc Quad: AROM;Both;10 reps Heel Slides: AAROM;10 reps;Right        Pertinent  Vitals/Pain Pain Assessment: Faces Faces Pain Scale: Hurts even more Pain Location: pt reports her low back hurts more than her right leg.  Pain Descriptors / Indicators: Grimacing;Aching;Sharp;Shooting Pain Intervention(s): Limited activity within patient's tolerance;Monitored during session;Repositioned           PT Goals (current goals can now be found in the care plan section) Acute Rehab PT Goals Patient Stated Goal: rehab before home Progress towards PT goals: Progressing toward goals    Frequency    Min 3X/week      PT Plan Current plan remains appropriate       End of Session   Activity Tolerance: Patient limited by fatigue;Patient limited by pain Patient left: in chair;with call bell/phone within reach;with family/visitor present     Time: 1911-2000 PT Time Calculation (min) (ACUTE ONLY): 49 min  Charges:  $Therapeutic Activity: 23-37 mins                      Sue Mcalexander B. Cailynn Bodnar, PT, DPT [phone]   08/09/2016, 11:20 PM

## 2016-08-09 NOTE — Op Note (Signed)
NAME:  Patricia Hopkins, Patricia Hopkins           ACCOUNT NO.:  [id number]  MEDICAL RECORD NO.:  [id number]  LOCATION:                                 FACILITY:  PHYSICIAN:  Samson FredericBrian Caedan Sumler, MD     DATE OF BIRTH:  1931-10-28  DATE OF PROCEDURE:  08/06/2016 DATE OF DISCHARGE:                              OPERATIVE REPORT   SURGEON:  Samson FredericBrian Dafna Romo, MD  ASSISTANT:  Patrick Jupiterarla Bethune, RNFA  PREOPERATIVE DIAGNOSIS:  Subacute right intertrochanteric femur fracture with impending malunion.  POSTOPERATIVE DIAGNOSIS:  Subacute right intertrochanteric femur fracture with impending malunion.  PROCEDURE PERFORMED:  Open reduction intramedullary nailing of right intertrochanteric femur fracture.  IMPLANTS: 1. Biomet Affixus hip fracture nail size 11 x 360 mm, 125 degrees. 2. 10.5 x 85 mm lag screw. 3. 5.0 x 36 mm distal interlock screw x1.  ANESTHESIA:  General.  EBL:  400 mL.  ANTIBIOTICS:  900 mg clindamycin.  SPECIMENS:  None.  TUBES AND DRAINS:  None.  DISPOSITION:  Stable to PACU.  INDICATIONS:  The patient is an 83 year old female who fell and experienced right hip pain about a month ago.  She was brought to the emergency department by her family.  X-rays and CT scan were negative for fracture.  She was discharged home.  Had progressive pain.  After several weeks, she was brought back to the hospital and found to have a displaced intertrochanteric femur fracture.  She was seen evaluated by my partner, Dr. Aundria Rudogers who attempted an intramedullary procedure.  He had her on a fracture table supine and was unable to reduce the fracture despite percutaneous application of a bone hook and Cobb elevator device.  I was then asked to assume care.  We discussed the risks, benefits, alternatives to open reduction, takedown of her impending callus and IM fixation.  DESCRIPTION OF PROCEDURE IN DETAIL:  I identified the patient in the holding area.  I marked the surgical site.  She was taken to  the operating room.  General anesthesia was induced on her bed.  She was transferred to the operating room table.  She was placed in a lateral decubitus position.  Axillary roll was placed.  She was secured with a bean bag.  The right hip was prepped and draped in normal sterile surgical fashion.  Time-out was called verifying side and site of surgery.  She did receive IV antibiotics within 60 minutes of beginning the procedure.  I began by utilizing the previous incision over the lateral femur.  I carried the incision both proximally and distally. Fluoroscopy was brought in.  I attempted to reduce the fracture closed. The fracture was essentially stuck in a varus mal reduction.  I split the IT band in line with the fibers.  I identified the vastus lateralis, which I reflected anteriorly.  I identified the fracture.  She had a comminuted pertrochanteric femur fracture.  The greater trochanter was comminuted in several fragments.  She had a coronal split in the proximal neck as well.  I attempted to reduce the fracture with traction and rotation with appropriate abduction, and she remained in a valgus. I used a bone tamp through the greater trochanter.  Fracture site, and I  pressed on the neck fragment to attempt a pusher out of Veress.  I did take down a significant amount of fracture callus at the reverse obliquity fracture site.  I was able to improve the neck shaft angle to an acceptable degree.  I made a separate incision proximally.  I used the awl to determine the starting point for a trochanteric entry nail. I then passed the guide pin entry reamer.  I passed the guide wire down to the physeal scar of the knee.  I measured the length of the nail.  I sequentially reamed to a 12.5.  I selected an 11 nail.  It was assembled on the jig.  Fracture reduction was held as I passed the nail through the cannula.  I inserted a guide pin for a cephalomedullary screw.  This pin was checked on  AP and lateral fluoroscopy views.  I measured, I reamed, I placed the real screw.  I turned my attention distally.  I used perfect circle technique to place a single distal interlock screw. I performed a live fluoroscopy of the hip to ensure that there was no chondral penetration.  Tip-apex distance was excellent.  I did recreate her neck shaft angle.  She did have a slight medial step-off of the shaft on the proximal fragment, but overall, the neck shaft angle was recreated very nicely.  I then copiously irrigated the wound.  I closed the wound in layers with 1 Vicryl for the IT band and fascia, 2-0 Monocryl for the deep dermal layer, staples for the skin.  Glue was applied.  After the glue was dried, sterile dressings were applied.  She was then extubated and taken to PACU in stable condition.  Sponge, needle, and instrument counts were correct at the end of the case x2. There were no known complications.  Postoperatively, we will readmit the patient to the hospitalist.  She will be touchdown weightbearing right lower extremity with a walker.  No active abduction for 6 weeks.  We will start Lovenox for DVT prophylaxis for a total of 30 days.  She will have physical and occupational therapy.  She will need disposition planning.  I will see her in the office in 2 weeks after discharge for repeat radiographs and wound check.    ______________________________ Samson Frederic, MD   ______________________________ Samson Frederic, MD    BS/MEDQ  D:  08/06/2016  T:  08/07/2016  Job:  [id number]

## 2016-08-09 NOTE — Progress Notes (Addendum)
Patient ID: Patricia Hopkins, female   DOB: [birth year], 83 y.o.   MRN: [id number]  PROGRESS NOTE    AIYANNAH FAYAD  WUJ:[id number] DOB: 10-Feb-1932 DOA: 08/01/2016  PCP: Frederich Chick, MD   Brief Narrative:  83 y.o.female with a history of Parkinson disease, asthma, Bell's palsy, essential hypertension, osteoporosis who presented to ED status post fall and right hip fracture. She underwent closed reduction and attempted open reduction of hip fracture 1/30 but per ortho needed another surgery. She underwent right intramedullary femoral nail 2/2.  Assessment & Plan:   Delirium - Likely from history of Parkinson's disease - Ammonia normal - CT head no acute intracranial process. MRI head negative. - Chest x ray negative, urine culture no growth - No signs of acute infection. - Mental status better   Acute blood loss anemia/postop acute blood loss anemia - Hb drop to 7.1 from 11. Hb on admission at 13 - Held Lovenox - Hgb 7.4; will check CBC in am and transfuse if further drop   Acute right hip fracture - Underwent attempt of IM nail 1-30, procedure aborted due to callus and bones findings.  - Transferred to [facility] 2[facility]1 for surgery - Underwent right femoral intramedullary nail - Per PT - SNF recommended  Acute respiratory failure with hypoxia - No acute cardiopulmonary findings on CXR  Slurred speech - CT head negative, MRI head negative. - Stable  Moderate protein calorie malnutrition - In the context of acute illness - Seen by dietician   Parkinson disease - May resume meds on discharge   Chronic diastolic heart failure - Last EF of 60-65% on 11/26/2015 with grade 1 diastolic dysfunction. - LLE more swollen than right. Lower extremity Dopplers negative for DVT - Stable resp status   Hyponatremia - Likely secondary to dehydration - Improved with hydration - Will likely not resume HCTZ on discharge.  Essential hypertension - BP stable without BP  meds  Asthma - Not in acute exacerbation - Stable   Bells palsy - Chronic right facial droop.  Stage 2 sacral ulcer - Seen by WOC, appreciate their assessment.    DVT prophylaxis: Lovenox subQ Code Status: DNR/DNI Family Communication: Updated patient and daughter at bedside. Disposition Plan: to SNF in am if she feels better    Consultants:   Orthopedics: Dr. Aundria Rud 08/03/2016  Procedures:   CT head 08/01/2016  MRI brain 08/07/2016  xray of the pelvis 08/06/2016  Chest x-ray 08/01/2016  X-ray of the right hip and pelvis 08/03/2016  X-ray of the right femur 08/06/2016  Left lower extremity Dopplers 08/08/2016  Right femoral intramedullary nail 08/06/2016   Antimicrobials:   IV clindamycin 08/03/2016 --> 08/07/2016   Subjective: No overnight events.  Objective: Vitals:   08/08/16 1500 08/08/16 2126 08/09/16 0502 08/09/16 1434  BP: (!) 120/57 129/84 123/63 130/64  Pulse: 89 80 71 80  Resp: 16 16 14 16   Temp: 98.1 F (36.7 C) 98.3 F (36.8 C) 98.3 F (36.8 C) 97.4 F (36.3 C)  TempSrc:  Oral Oral   SpO2: 98% 97% 97% 95%  Weight:      Height:       No intake or output data in the 24 hours ending 08/09/16 1542 Filed Weights   08/01/16 1700 08/06/16 0626 08/07/16 0700  Weight: 45.4 kg (100 lb) 51.1 kg (112 lb 10.5 oz) 55.3 kg (122 lb)    Examination:  General exam: Appears calm and comfortable  Respiratory system: Clear to auscultation. Respiratory effort normal. Cardiovascular  system: S1 & S2 heard, Rate controlled  Gastrointestinal system: Abdomen is nondistended, soft and nontender. No organomegaly or masses felt. Normal bowel sounds heard. Central nervous system: Alert and oriented. No focal neurological deficits. Extremities: Symmetric 5 x 5 power. Trace pedal edema Skin: No rashes, lesions or ulcers Psychiatry: Judgement and insight appear normal. Mood & affect appropriate.   Data Reviewed: I have personally reviewed following  labs and imaging studies  CBC:  Recent Labs Lab 08/06/16 1537 08/06/16 2301 08/07/16 0916 08/08/16 0324 08/08/16 1610 08/09/16 0441  WBC 10.7* 16.3* 11.5* 6.7  --  6.9  HGB 9.7* 8.7* 8.7* 7.1* 7.6* 7.4*  HCT 29.3* 25.6* 26.8* 21.7* 22.4* 22.5*  MCV 91.8 92.1 91.8 91.2  --  92.2  PLT 319 290 383 299  --  280   Basic Metabolic Panel:  Recent Labs Lab 08/04/16 1456 08/05/16 0446 08/06/16 2301 08/07/16 0916 08/08/16 0324 08/09/16 0441  NA 131* 133*  --  133* 134* 133*  K 3.4* 4.1  --  5.1 3.7 3.8  CL 98* 102  --  97* 99* 98*  CO2 24 23  --  23 27 26   GLUCOSE 114* 87  --  92 83 84  BUN 14 16  --  13 17 19   CREATININE 0.65 0.46 0.38* 0.43* 0.40* 0.38*  CALCIUM 7.9* 7.8*  --  8.1* 8.0* 7.8*   GFR: Estimated Creatinine Clearance: 41.9 mL/min (by C-G formula based on SCr of 0.38 mg/dL (L)). Liver Function Tests: No results for input(s): AST, ALT, ALKPHOS, BILITOT, PROT, ALBUMIN in the last 168 hours. No results for input(s): LIPASE, AMYLASE in the last 168 hours. No results for input(s): AMMONIA in the last 168 hours. Coagulation Profile: No results for input(s): INR, PROTIME in the last 168 hours. Cardiac Enzymes: No results for input(s): CKTOTAL, CKMB, CKMBINDEX, TROPONINI in the last 168 hours. BNP (last 3 results) No results for input(s): PROBNP in the last 8760 hours. HbA1C: No results for input(s): HGBA1C in the last 72 hours. CBG:  Recent Labs Lab 08/08/16 1129  GLUCAP 93   Lipid Profile: No results for input(s): CHOL, HDL, LDLCALC, TRIG, CHOLHDL, LDLDIRECT in the last 72 hours. Thyroid Function Tests: No results for input(s): TSH, T4TOTAL, FREET4, T3FREE, THYROIDAB in the last 72 hours. Anemia Panel:  Recent Labs  08/08/16 1033  VITAMINB12 403  FOLATE 5.7*  FERRITIN 312*  TIBC 202*  IRON 35  RETICCTPCT 5.3*   Urine analysis:    Component Value Date/Time   COLORURINE AMBER (A) 08/01/2016 1300   APPEARANCEUR CLEAR 08/01/2016 1300   LABSPEC  1.033 (H) 08/01/2016 1300   PHURINE 5.0 08/01/2016 1300   GLUCOSEU NEGATIVE 08/01/2016 1300   HGBUR NEGATIVE 08/01/2016 1300   BILIRUBINUR NEGATIVE 08/01/2016 1300   KETONESUR 20 (A) 08/01/2016 1300   PROTEINUR NEGATIVE 08/01/2016 1300   NITRITE NEGATIVE 08/01/2016 1300   LEUKOCYTESUR NEGATIVE 08/01/2016 1300   Sepsis Labs: @LABRCNTIP (procalcitonin:4,lacticidven:4)    Culture, blood (Routine x 2)     Status: None   Collection Time: 08/01/16 12:56 PM  Result Value Ref Range Status   Specimen Description BLOOD LEFT ANTECUBITAL  Final   Special Requests BOTTLES DRAWN AEROBIC AND ANAEROBIC 5CC EACH  Final   Culture   Final    NO GROWTH 5 DAYS Performed at [facility], [address] [place], [place] [address]    Report Status 08/06/2016 FINAL  Final  Urine culture     Status: None   Collection  Time: 08/01/16  1:00 PM  Result Value Ref Range Status   Specimen Description URINE, CLEAN CATCH  Final   Special Requests NONE  Final   Culture   Final    NO GROWTH Performed at [facility], [address] [place], [place] [address]    Report Status 08/02/2016 FINAL  Final  Culture, blood (Routine x 2)     Status: None   Collection Time: 08/01/16  2:00 PM  Result Value Ref Range Status   Specimen Description BLOOD RIGHT ARM  Final   Special Requests NONE  Final   Culture   Final    NO GROWTH 5 DAYS Performed at [facility], [address] [place], [place] [address]    Report Status 08/06/2016 FINAL  Final  Surgical PCR screen     Status: None   Collection Time: 08/03/16  4:53 PM  Result Value Ref Range Status   MRSA, PCR NEGATIVE NEGATIVE Final   Staphylococcus aureus NEGATIVE NEGATIVE Final      Radiology Studies: Mr Brain 38 Contrast Result Date: 08/08/2016 No acute intracranial process. Moderate chronic small vessel ischemic disease.   Pelvis Portable Result Date: 2/2/2018ORIF with intramedullary right femoral nail   Dg C-arm Gt 120 Min Result  Date: 08/06/2016 RIGHT femur ORIF.   Dg Femur, Min 2 Views Right Result Date: 08/06/2016 RIGHT femur ORIF.   Dg Femur [place], [place] 2 Views Right Result Date: 08/06/2016 ORIF with intramedullary femoral nail fixation.       Scheduled Meds: . carbidopa-levodopa  1 tablet Oral TID  . docusate sodium  100 mg Oral BID  . enoxaparin (LOVENOX) injection  30 mg Subcutaneous Q24H   Continuous Infusions:   LOS: 6 days    Time spent: 25 minutes  Greater than 50% of the time spent on counseling and coordinating the care.   Manson Passey, MD [facility] Pager [phone]  If 7PM-7AM, please contact night-coverage [email or web address] Password [facility] 08/09/2016, 3:42 PM

## 2016-08-09 NOTE — Progress Notes (Signed)
   Subjective:  Patient reports pain as mild to moderate.    Objective:   VITALS:   Vitals:   08/08/16 1132 08/08/16 1500 08/08/16 2126 08/09/16 0502  BP: (!) 111/51 (!) 120/57 129/84 123/63  Pulse: 83 89 80 71  Resp: 16 16 16 14   Temp: 98.2 F (36.8 C) 98.1 F (36.7 C) 98.3 F (36.8 C) 98.3 F (36.8 C)  TempSrc:   Oral Oral  SpO2: 97% 98% 97% 97%  Weight:      Height:        NAD ABD soft Sensation intact distally Intact pulses distally Dorsiflexion/Plantar flexion intact Incision: dressing C/D/I Compartment soft   Lab Results  Component Value Date   WBC 6.9 08/09/2016   HGB 7.4 (L) 08/09/2016   HCT 22.5 (L) 08/09/2016   MCV 92.2 08/09/2016   PLT 280 08/09/2016   BMET    Component Value Date/Time   NA 133 (L) 08/09/2016 0441   K 3.8 08/09/2016 0441   CL 98 (L) 08/09/2016 0441   CO2 26 08/09/2016 0441   GLUCOSE 84 08/09/2016 0441   BUN 19 08/09/2016 0441   CREATININE 0.38 (L) 08/09/2016 0441   CALCIUM 7.8 (L) 08/09/2016 0441   GFRNONAA >60 08/09/2016 0441   GFRAA >60 08/09/2016 0441     Assessment/Plan: 3 Days Post-Op   Active Problems:   Altered mental state   Asthma   Essential hypertension   Osteoporosis   Bell's palsy   Parkinson disease (HCC)   Closed right hip fracture (HCC)   Malnutrition of moderate degree   Acute delirium   Closed pertrochanteric fracture of femur with malunion, right   Pressure injury of skin   Postoperative anemia due to acute blood loss   TDWB LLE with walker DVT ppx: lovenox x30 days, SCDs, TEDs PT/OT PO pain control Dispo: SNF placement   Beatrice Ziehm, Cloyde ReamsBrian James 08/09/2016, 12:05 PM   Samson FredericBrian Alisi Lupien, MD Cell [phone]

## 2016-08-09 NOTE — Consult Note (Addendum)
WOC Nurse wound consult note Reason for Consult: Consult requested for sacrum.   Wound type: Deep tissue injury was noted as present on admission according to the nursing flow sheet.  This location has evolved into a stage 2 pressure injury when assessed on 2/5 Pressure Injury POA: Yes Measurement: 2.5X2.5X.1cm Wound bed: Dark red and moist Drainage (amount, consistency, odor) loose peeling skin around wound edges from previous blister which has ruptured, no odor, scant amt yellow drainage Dressing procedure/placement/frequency: Foam dressing to protect and promote healing.  Daughter at bedside; she assessed wound appearance and discussed plan of care and importance of positioning off the affected area to reduce pressure. Please re-consult if further assistance is needed.  Thank-you,  Cammie Mcgeeawn Priti Consoli MSN, RN, CWOCN, [place]AP, CNS [phone]

## 2016-08-10 ENCOUNTER — Encounter (HOSPITAL_COMMUNITY): Payer: Self-pay | Admitting: Orthopedic Surgery

## 2016-08-10 LAB — TYPE AND SCREEN
ABO/RH(D): A POS
Antibody Screen: NEGATIVE
UNIT DIVISION: 0
Unit division: 0

## 2016-08-10 LAB — CBC
HCT: 24.8 % — ABNORMAL LOW (ref 36.0–46.0)
Hemoglobin: 8.2 g/dL — ABNORMAL LOW (ref 12.0–15.0)
MCH: 30.9 pg (ref 26.0–34.0)
MCHC: 33.1 g/dL (ref 30.0–36.0)
MCV: 93.6 fL (ref 78.0–100.0)
PLATELETS: 430 10*3/uL — AB (ref 150–400)
RBC: 2.65 MIL/uL — ABNORMAL LOW (ref 3.87–5.11)
RDW: 15.5 % (ref 11.5–15.5)
WBC: 9.4 10*3/uL (ref 4.0–10.5)

## 2016-08-10 MED ORDER — ENSURE ENLIVE PO LIQD: 237.0000 mL | Freq: Three times a day (TID) | ORAL | 12 refills | Status: DC

## 2016-08-10 MED ORDER — ACETAMINOPHEN 325 MG PO TABS: 650.0000 mg | ORAL_TABLET | Freq: Four times a day (QID) | ORAL | 0 refills | Status: AC | PRN

## 2016-08-10 MED ORDER — DOCUSATE SODIUM 100 MG PO CAPS: 100.0000 mg | ORAL_CAPSULE | Freq: Two times a day (BID) | ORAL | 0 refills | Status: AC

## 2016-08-10 MED ORDER — POLYETHYLENE GLYCOL 3350 17 G PO PACK: 17.0000 g | PACK | Freq: Every day | ORAL | 0 refills | Status: AC

## 2016-08-10 MED ORDER — ENSURE ENLIVE PO LIQD
237.0000 mL | Freq: Three times a day (TID) | ORAL | Status: DC
  Administered 2016-08-10: 237 mL via ORAL

## 2016-08-10 MED ORDER — IPRATROPIUM-ALBUTEROL 0.5-2.5 (3) MG/3ML IN SOLN
3.0000 mL | Freq: Four times a day (QID) | RESPIRATORY_TRACT | 0 refills | Status: AC | PRN
Start: 2016-08-10 — End: ?

## 2016-08-10 NOTE — Progress Notes (Signed)
Nutrition Follow-up  DOCUMENTATION CODES:   Non-severe (moderate) malnutrition in context of acute illness/injury  INTERVENTION:  Provide Ensure Enlive po TID, each supplement provides 350 kcal and 20 grams of protein.  Encourage adequate PO intake.  NUTRITION DIAGNOSIS:   Increased nutrient needs related to other (see comment) (hip fracture) as evidenced by estimated needs; ongoing  GOAL:   Patient will meet greater than or equal to 90% of their needs; progressing  MONITOR:   PO intake, Supplement acceptance, Labs, Weight trends, Skin, I & O's  REASON FOR ASSESSMENT:   Low Braden    ASSESSMENT:   83 y.o. female with a history of Parkinson disease, asthma, Bell's palsy, essential hypertension, osteoporosis. She presents with altered mental status with concern for medication effect.  PROCEDURE: (2/2): INTRAMEDULLARY (IM) NAIL FEMORAL (Right)  Pt was confused during time of visit. Daughter at bedside reports she gets disoriented/confused once she wakes up. Meal completion has been 5-50%. Daughter reports pt only consumed a couple of bites of her food at breakfast this AM. Daughter does report pt has been consuming Ensure when nursing staff brings it in. Noted no orders placed for Ensure. RD to put in orders. Daughter reports possible discharge to SNF today. Education was given to continue nutritional supplements post discharge at SNF to aid in adequate nutrition.   Labs and medications reviewed.   Diet Order:  Diet regular Room service appropriate? Yes; Fluid consistency: Thin  Skin:  Wound (see comment) (Stage 2 on sacrum)  Last BM:  2/4  Height:   Ht Readings from Last 1 Encounters:  08/01/16 5' 1.5" (1.562 m)    Weight:   Wt Readings from Last 1 Encounters:  08/10/16 118 lb 9.6 oz (53.8 kg)    Ideal Body Weight:  50 kg  BMI:  Body mass index is 22.05 kg/m.  Estimated Nutritional Needs:   Kcal:  1350-1600  Protein:  60-70g  Fluid:  >/= 1.5  L/day  EDUCATION NEEDS:   Education needs addressed  Roslyn SmilingStephanie Samhita Kretsch, MS, RD, LDN Pager # [phone] After hours/ weekend pager # [phone]

## 2016-08-10 NOTE — Progress Notes (Signed)
Pt discharged to [facility] via [facility] in stable condition.

## 2016-08-10 NOTE — Discharge Summary (Addendum)
Physician Discharge Summary  Patricia Hopkins IHK:[id number] DOB: 03/09/1932 DOA: 08/01/2016  PCP: Frederich Chick, MD  Admit date: 08/01/2016 Discharge date: 08/10/2016  Recommendations for Outpatient Follow-up:  1. Please use Norvasc 5 mg a day ith BP above 140/90  Discharge Diagnoses:  Active Problems:   Altered mental state   Asthma   Essential hypertension   Osteoporosis   Bell's palsy   Parkinson disease (HCC)   Closed right hip fracture (HCC)   Malnutrition of moderate degree   Acute delirium   Closed pertrochanteric fracture of femur with malunion, right   Pressure injury of skin   Postoperative anemia due to acute blood loss    Discharge Condition: stable   Diet recommendation: as tolerated   History of present illness:  83 y.o.female with a history of Parkinson disease, asthma, Bell's palsy, essential hypertension, osteoporosis who presented to ED status post fall and right hip fracture. She underwent closed reduction and attempted open reduction of hip fracture 1/30 but per ortho needed another surgery. She underwent right intramedullary femoral nail 2/2.  Hospital Course:  Delirium - Likely from history of Parkinson's disease - Ammonia normal - CT head no acute intracranial process. MRI head negative. - Chest x ray negative, urine culture no growth - No signs of acute infection. - Mental status better   Acute blood loss anemia/postop acute blood loss anemia - Hb drop to 7.53from 11. Hb on admission at 13 - Held Lovenox - Hgb 8.2 this am  Acute right hip fracture - Underwent attempt of IM nail 1-30, procedure aborted due to callus and bones findings.  - Transferred to [facility] 2/1 for surgery - Underwent right femoral intramedullary nail - Per PT - SNF recommended  Acute respiratory failure with hypoxia - No acute cardiopulmonary findings on CXR  Slurred speech - CT head negative, MRI head negative. - Stable  Moderate protein calorie malnutrition -  In the context of acute illness - Seen by dietician   Parkinson disease - May resume meds on discharge   Chronic diastolic heart failure - Last EF of 60-65% on 11/26/2015 with grade 1 diastolic dysfunction. - LLE more swollen than right. Lower extremity Dopplers negative for DVT - Stable resp status   Hyponatremia - Likely secondary to dehydration - Improved with hydration - Will likely not resume HCTZ on discharge.  Essential hypertension - BP stable without BP meds  Asthma - Not in acute exacerbation - Stable   Bells palsy - Chronic right facial droop.    DVT prophylaxis:Lovenox subQ Code Status:DNR/DNI Family Communication:Updated patient and daughter at bedside.    Consultants:  Orthopedics: Dr. Aundria Rud 08/03/2016  Procedures:  CT head01/28/2018  MRI brain 08/07/2016  xrayof the pelvis 08/06/2016  Chest x-ray 08/01/2016  X-ray of the right hip and pelvis 08/03/2016  X-ray of the right femur 08/06/2016  Left lower extremity Dopplers 08/08/2016  Right femoral intramedullary nail 08/06/2016   Antimicrobials:   IV clindamycin 08/03/2016 -->08/07/2016   Signed:  Manson Passey, MD  [facility] 08/10/2016, 2:53 PM  Pager #: [phone]  Time spent in minutes: less than 30 minutes    Discharge Exam: Vitals:   08/10/16 0458 08/10/16 0913  BP: 137/71 127/60  Pulse: 76 77  Resp: 16 18  Temp: 97.6 F (36.4 C) 98.2 F (36.8 C)   Vitals:   08/09/16 1714 08/09/16 2108 08/10/16 0458 08/10/16 0913  BP:  118/60 137/71 127/60  Pulse:  81 76 77  Resp:  16 16 18  Temp:  98.7 F (37.1 C) 97.6 F (36.4 C) 98.2 F (36.8 C)  TempSrc:  Oral Oral Oral  SpO2: 96% 99% 97% 97%  Weight:   53.8 kg (118 lb 9.6 oz)   Height:        General: Pt is alert, follows commands appropriately, not in acute distress Cardiovascular: Regular rate and rhythm, S1/S2 + Respiratory: Clear to auscultation bilaterally, no wheezing, no  crackles, no rhonchi Abdominal: Soft, non tender, non distended, bowel sounds +, no guarding Extremities: no edema, no cyanosis, pulses palpable bilaterally DP and PT Neuro: Grossly nonfocal  Discharge Instructions  Discharge Instructions    Call MD for:  persistant nausea and vomiting    Complete by:  As directed    Call MD for:  redness, tenderness, or signs of infection (pain, swelling, redness, odor or green/yellow discharge around incision site)    Complete by:  As directed    Call MD for:  severe uncontrolled pain    Complete by:  As directed    Diet - low sodium heart healthy    Complete by:  As directed    Discharge instructions    Complete by:  As directed    If BP above 140/90 then please use Norvasc 5 mg a day. Current BP stable without antihypertensives.   Increase activity slowly    Complete by:  As directed      Allergies as of 08/10/2016      Reactions   Fosamax [alendronate Sodium] Nausea Only   Has taken Reclast  One other time did not have any problems   Penicillins Other (See Comments)   UNSPECIFIED REACTION  Has patient had a PCN reaction causing immediate rash, facial/tongue/throat swelling, SOB or lightheadedness with hypotension: unknown Has patient had a PCN reaction causing severe rash involving mucus membranes or skin necrosis:unknown Has patient had a PCN reaction that required hospitalization : unknown Has patient had a PCN reaction occurring within the last 10 years: no If all of the above answers are "NO", then may proceed with Cephalosporin use.   Crestor [rosuvastatin] Other (See Comments)   NO ENERGY   Pravachol [pravastatin Sodium] Other (See Comments)   NO ENERGY   Zocor [simvastatin] Other (See Comments)   NO ENERGY      Medication List    STOP taking these medications   albuterol (2.5 MG/3ML) 0.083% nebulizer solution Commonly known as:  PROVENTIL   HYDROcodone-acetaminophen 5-325 MG tablet Commonly known as:  NORCO/VICODIN    losartan-hydrochlorothiazide 100-25 MG tablet Commonly known as:  HYZAAR   naproxen sodium 220 MG tablet Commonly known as:  ANAPROX     TAKE these medications   acetaminophen 325 MG tablet Commonly known as:  TYLENOL Take 2 tablets (650 mg total) by mouth every 6 (six) hours as needed for mild pain (or Fever >/= 101).   carbidopa-levodopa 25-100 MG tablet Commonly known as:  SINEMET IR TAKE 1 TABLET BY MOUTH THREE TIMES DAILY   docusate sodium 100 MG capsule Commonly known as:  COLACE Take 1 capsule (100 mg total) by mouth 2 (two) times daily.   enoxaparin 30 MG/0.3ML injection Commonly known as:  LOVENOX Inject 0.3 mLs (30 mg total) into the skin daily.   feeding supplement (ENSURE ENLIVE) Liqd Take 237 mLs by mouth 3 (three) times daily between meals.   ipratropium 0.02 % nebulizer solution Commonly known as:  ATROVENT Take 500 mcg by nebulization every 4 (four) hours as needed for wheezing or shortness  of breath.   ipratropium-albuterol 0.5-2.5 (3) MG/3ML Soln Commonly known as:  DUONEB Take 3 mLs by nebulization every 6 (six) hours as needed.   polyethylene glycol packet Commonly known as:  MIRALAX Take 17 g by mouth daily.   traMADol-acetaminophen 37.5-325 MG tablet Commonly known as:  ULTRACET Take 1 tablet by mouth every 6 (six) hours as needed.   vitamin B-12 1000 MCG tablet Commonly known as:  CYANOCOBALAMIN Take 1,000 mcg by mouth daily.        Contact information for follow-up providers    [facility] Follow up.   Why:  hospital bed Contact information: [address] [place] [address] [phone]        [facility] Follow up.   Specialty:  Home Health Services Why:  nurse, physical therapy, occupational therapy Contact information: [address] [place] [address] [phone]        Swinte[place], Cloyde Reams, MD Follow up in 2 week(s).   Specialty:  Orthopedic Surgery Why:  For suture  removal, For wound re-check Contact information: [address] [place] [address] [phone]        Frederich Chick, MD. Schedule an appointment as soon as possible for a visit.   Specialty:  Family Medicine Contact information: [address] [place] [address] [phone]            Contact information for after-discharge care    Destination    [facility] Follow up.   Specialty:  Skilled Nursing Facility Contact information: [address] [place] [address] [phone]                   The results of significant diagnostics from this hospitalization (including imaging, microbiology, ancillary and laboratory) are listed below for reference.    Significant Diagnostic Studies: Dg Chest 2 View  Result Date: 08/01/2016 CLINICAL DATA:  83 y/o  F; confusion and weakness. EXAM: CHEST  2 VIEW COMPARISON:  07/06/2015 chest radiograph FINDINGS: Stable mild cardiomegaly. Tortuous aorta and aortic atherosclerosis with calcification. Question small hiatal hernia. A chronic right-sided rib fractures again noted. Deformity of left proximal humerus probably representing sequelae of old fracture. Linear opacity at the left lung base is probably atelectasis. No focal consolidation. No pleural effusion. IMPRESSION: Stable cardiomegaly. Mild aortic atherosclerosis. Question small hiatal hernia. Left basilar atelectasis. Electronically Signed   By: Mitzi Hansen M.D.   On: 08/01/2016 13:58   Ct Head Wo Contrast  Result Date: 08/01/2016 CLINICAL DATA:  Patient with altered level of consciousness 10 days prior. Urinary tract infection. EXAM: CT HEAD WITHOUT CONTRAST TECHNIQUE: Contiguous axial images were obtained from the base of the skull through the vertex without intravenous contrast. COMPARISON:  Brain CT 07/12/2009 FINDINGS: Brain: Ventricles and sulci are prominent compatible with  atrophy. Periventricular and subcortical white matter hypodensity compatible with chronic microvascular ischemic changes. No evidence for acute cortically based infarct, intracranial hemorrhage, mass lesion or mass-effect. Vascular: Unremarkable. Skull: Intact. Sinuses/Orbits: Paranasal sinuses and mastoid air cells are unremarkable. Orbits are unremarkable. Other: None. IMPRESSION: No acute intracranial process. Cortical atrophy and chronic microvascular ischemic changes. Electronically Signed   By: Annia Belt M.D.   On: 08/01/2016 17:07   Ct Pelvis Wo Contrast  Result Date: 07/16/2016 CLINICAL DATA:  Right hip and pelvic pain secondary to a fall out of bed 2 nights ago. History of previous left pelvic fractures in  September 2016. EXAM: CT PELVIS WITHOUT CONTRAST TECHNIQUE: Multidetector CT imaging of the pelvis was performed following the standard protocol without intravenous contrast. COMPARISON:  None. FINDINGS: Musculoskeletal: There are old nonunion fractures of the left inferior and superior pubic rami. Old healed fracture of the left sacral ala. No acute fractures. No dislocations. Urinary Tract: 5.2 mm low-density lesion on the lower pole of the left kidney consistent with a cyst. The bladder appears normal. Bowel: Diverticula in the distal colon. Moderate amount of stool in the rectum. Vascular/Lymphatic: Extensive aortic atherosclerosis. No adenopathy. Reproductive:  Uterus and ovaries appear to have been removed. Other:  None IMPRESSION: No acute bone abnormality. 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 Aortic atherosclerosis. Electronically Signed   By: Francene Boyers M.D.   On: 07/16/2016 13:58   Mr Brain Wo Contrast  Result Date: 08/08/2016 CLINICAL DATA:  Increasing confusion since August 01, 2016. Recent hip pinning. History of hypertension and Parkinson's disease. EXAM: MRI HEAD WITHOUT CONTRAST TECHNIQUE: Multiplanar, multiecho pulse sequences of the brain and  surrounding structures were obtained without intravenous contrast. COMPARISON:  CT HEAD August 01, 2016 FINDINGS: BRAIN: No reduced diffusion to suggest acute ischemia. No susceptibility artifact to suggest hemorrhage. The ventricles and sulci are normal for patient's age. Patchy to confluent supratentorial white matter FLAIR T2 hyperintensities. No suspicious parenchymal signal, masses or mass effect. No abnormal extra-axial fluid collections. VASCULAR: Normal major intracranial vascular flow voids present at skull base. SKULL AND UPPER CERVICAL SPINE: No abnormal sellar expansion. No suspicious calvarial bone marrow signal. Craniocervical junction maintained. SINUSES/ORBITS: Atretic maxillary sinuses. Trace mastoid effusions. Status post RIGHT ocular lens implant. The included ocular globes and orbital contents are non-suspicious. OTHER: Patient is edentulous. IMPRESSION: No acute intracranial process. Moderate chronic small vessel ischemic disease. Electronically Signed   By: Awilda Metro M.D.   On: 08/08/2016 01:07   Pelvis Portable  Result Date: 08/06/2016 CLINICAL DATA:  Right hip fracture EXAM: PORTABLE PELVIS 1-2 VIEWS COMPARISON:  08/03/2016 FINDINGS: Portable postoperative imaging demonstrates the proximal portion of an intramedullary right femoral nail with interlocking femoral neck fixation. Good alignment on this single supine projection. Remote healed fracture deformities of the left pubic rami. IMPRESSION: ORIF with intramedullary right femoral nail Electronically Signed   By: Ellery Plunk M.D.   On: 08/06/2016 21:51   Dg C-arm 1-60 Min-no Report  Result Date: 08/03/2016 Fluoroscopy was utilized by the requesting physician.  No radiographic interpretation.   Dg C-arm Gt 120 Min  Result Date: 08/06/2016 CLINICAL DATA:  RIGHT hip intramedullary nail. EXAM: DG C-ARM GT 120 MIN; RIGHT FEMUR 2 VIEWS FLUOROSCOPY TIME:  Fluoroscopy Time:  3 minutes and 53 seconds Radiation Exposure Index  (if provided by the fluoroscopic device): Not available Number of Acquired Spot Images: 5 COMPARISON:  Pelvic radiograph August 03, 2016 FINDINGS: Interpreting radiologist was not present at time of operation. Femur intramedullary rod and RIGHT femoral neck pinning. IMPRESSION: RIGHT femur ORIF. Electronically Signed   By: Awilda Metro M.D.   On: 08/06/2016 22:42   Dg Hip Operative Unilat W Or W/o Pelvis Right  Result Date: 08/03/2016 CLINICAL DATA:  Attempted internal fixation of right femoral fracture. Initial encounter. EXAM: OPERATIVE RIGHT HIP (WITH PELVIS IF PERFORMED) 2 VIEWS TECHNIQUE: Fluoroscopic spot image(s) were submitted for interpretation post-operatively. COMPARISON:  Right hip radiographs performed earlier today at 10:13 a.m. FINDINGS: Two fluoroscopic C-arm images are provided from the OR. The fracture could not be fully reduced for internal fixation  at this time. IMPRESSION: The right femoral fracture could not be fully reduced for internal fixation at this time. Electronically Signed   By: Roanna RaiderJeffery  Chang M.D.   On: 08/03/2016 20:26   Dg Hip Unilat With Pelvis 2-3 Views Right  Result Date: 08/03/2016 CLINICAL DATA:  Diffuse pain.  Fall . EXAM: DG HIP (WITH OR WITHOUT PELVIS) 2-3V RIGHT COMPARISON:  07/16/2016.  CT 07/16/2016.  Exam. FINDINGS: Angulated comminuted fracture of the right hip is noted. Deformity noted the left pubis consistent with old injury. Similar findings noted on prior exam. Diffuse osteopenia degenerative change. Peripheral vascular calcification IMPRESSION: 1. Acute right intertrochanteric hip fracture with angulation deformity. 2.  Old fractures left pubis. 3. Peripheral vascular disease . Electronically Signed   By: Maisie Fushomas  Register   On: 08/03/2016 10:35   Dg Hip Unilat  With Pelvis 2-3 Views Right  Result Date: 07/16/2016 CLINICAL DATA:  Pain following fall EXAM: DG HIP (WITH OR WITHOUT PELVIS) 2-3V RIGHT COMPARISON:  None. FINDINGS: Frontal pelvis as  well as frontal and lateral right hip images were obtained. There is an old healed fracture of the midportion left ischium. There is also an old healed fracture of the medial aspect of the left superior pubic ramus No acute fracture or dislocation evident. There is symmetric narrowing of both hip joints, moderate. No erosive change. IMPRESSION: Prior fractures left ischium and medial left superior pubic ramus with remodeling. No acute fracture or dislocation. Moderate symmetric narrowing of both hip joints. Electronically Signed   By: Bretta BangWilliam  Woodruff III M.D.   On: 07/16/2016 12:59   Dg Femur, Min 2 Views Right  Result Date: 08/06/2016 CLINICAL DATA:  RIGHT hip intramedullary nail. EXAM: DG C-ARM GT 120 MIN; RIGHT FEMUR 2 VIEWS FLUOROSCOPY TIME:  Fluoroscopy Time:  3 minutes and 53 seconds Radiation Exposure Index (if provided by the fluoroscopic device): Not available Number of Acquired Spot Images: 5 COMPARISON:  Pelvic radiograph August 03, 2016 FINDINGS: Interpreting radiologist was not present at time of operation. Femur intramedullary rod and RIGHT femoral neck pinning. IMPRESSION: RIGHT femur ORIF. Electronically Signed   By: Awilda Metroourtnay  Bloomer M.D.   On: 08/06/2016 22:42   Dg Femur [place] 2 Views Right  Result Date: 08/06/2016 CLINICAL DATA:  Right hip fracture EXAM: RIGHT FEMUR PORTABLE 2 VIEW COMPARISON:  08/03/2016 FINDINGS: Portable postoperative imaging demonstrates intramedullary right femoral nail with interlocking femoral neck fixation and with a single distal interlocking screw. The alignment. No immediate complication is evident. IMPRESSION: ORIF with intramedullary femoral nail fixation. Electronically Signed   By: Ellery Plunkaniel R Mitchell M.D.   On: 08/06/2016 21:52    Microbiology: Culture, blood (Routine x 2)     Status: None   Collection Time: 08/01/16 12:56 PM  Result Value Ref Range Status   Specimen Description BLOOD LEFT ANTECUBITAL  Final   Special Requests BOTTLES DRAWN  AEROBIC AND ANAEROBIC 5CC EACH  Final   Culture   Final    NO GROWTH 5 DAYS Performed at [facility], [address] [place], [place] [address]    Report Status 08/06/2016 FINAL  Final  Urine culture     Status: None   Collection Time: 08/01/16  1:00 PM  Result Value Ref Range Status   Specimen Description URINE, CLEAN CATCH  Final   Special Requests NONE  Final   Culture   Final    NO GROWTH Performed at [facility], [address] [place], [place] [address]  Report Status 08/02/2016 FINAL  Final  Culture, blood (Routine x 2)     Status: None   Collection Time: 08/01/16  2:00 PM  Result Value Ref Range Status   Specimen Description BLOOD RIGHT ARM  Final   Special Requests NONE  Final   Culture   Final    NO GROWTH 5 DAYS Performed at [facility], [address], [place], [place] [address]    Report Status 08/06/2016 FINAL  Final  Surgical PCR screen     Status: None   Collection Time: 08/03/16  4:53 PM  Result Value Ref Range Status   MRSA, PCR NEGATIVE NEGATIVE Final   Staphylococcus aureus NEGATIVE NEGATIVE Final     Labs: Basic Metabolic Panel:  Recent Labs Lab 08/04/16 1456 08/05/16 0446 08/06/16 2301 08/07/16 0916 08/08/16 0324 08/09/16 0441  NA 131* 133*  --  133* 134* 133*  K 3.4* 4.1  --  5.1 3.7 3.8  CL 98* 102  --  97* 99* 98*  CO2 24 23  --  23 27 26   GLUCOSE 114* 87  --  92 83 84  BUN 14 16  --  13 17 19   CREATININE 0.65 0.46 0.38* 0.43* 0.40* 0.38*  CALCIUM 7.9* 7.8*  --  8.1* 8.0* 7.8*   Liver Function Tests: No results for input(s): AST, ALT, ALKPHOS, BILITOT, PROT, ALBUMIN in the last 168 hours. No results for input(s): LIPASE, AMYLASE in the last 168 hours. No results for input(s): AMMONIA in the last 168 hours. CBC:  Recent Labs Lab 08/06/16 2301 08/07/16 0916 08/08/16 0324 08/08/16 1610 08/09/16 0441 08/10/16 1148  WBC 16.3* 11.5* 6.7  --  6.9 9.4  HGB 8.7* 8.7* 7.1* 7.6* 7.4* 8.2*  HCT 25.6* 26.8*  21.7* 22.4* 22.5* 24.8*  MCV 92.1 91.8 91.2  --  92.2 93.6  PLT 290 383 299  --  280 430*   Cardiac Enzymes: No results for input(s): CKTOTAL, CKMB, CKMBINDEX, TROPONINI in the last 168 hours. BNP: BNP (last 3 results) No results for input(s): BNP in the last 8760 hours.  ProBNP (last 3 results) No results for input(s): PROBNP in the last 8760 hours.  CBG:  Recent Labs Lab 08/08/16 1129  GLUCAP 93

## 2016-08-10 NOTE — Progress Notes (Signed)
Reviewed AVS/discharge instructions/medications with patient and patient's daughter.  [facility] has urinated and [facility]

## 2016-08-10 NOTE — Discharge Instructions (Signed)
Dr. Samson FredericBrian Swinteck Adult Hip & Knee Specialist [facility] [address] [place], [place] [address] [phone]   POSTOPERATIVE DIRECTIONS    Hip Rehabilitation, Guidelines Following Surgery   WEIGHT BEARING Partial weight bearing with assist device as directed.  Touch Down Weight Bearing (30%)   HOME CARE INSTRUCTIONS  Remove items at home which could result in a fall. This includes throw rugs or furniture in walking pathways.  Continue medications as instructed at time of discharge.  You may have some home medications which will be placed on hold until you complete the course of blood thinner medication.  4 days after discharge, you may start showering. No tub baths or soaking your incisions. Do not put on socks or shoes without following the instructions of your caregivers.   Sit on chairs with arms. Use the chair arms to help push yourself up when arising.  Arrange for the use of a toilet seat elevator so you are not sitting low.   Walk with walker as instructed.  You may resume a sexual relationship in one month or when given the OK by your caregiver.  Use walker as long as suggested by your caregivers.  Avoid periods of inactivity such as sitting longer than an hour when not asleep. This helps prevent blood clots.  You may return to work once you are cleared by Designer, industrial/productyour surgeon.  Do not drive a car for 6 weeks or until released by your surgeon.  Do not drive while taking narcotics.  Wear elastic stockings for two weeks following surgery during the day but you may remove then at night.  Make sure you keep all of your appointments after your operation with all of your doctors and caregivers. You should call the office at the above phone number and make an appointment for approximately two weeks after the date of your surgery. Please pick up a stool softener and laxative for home use as long as you are requiring pain medications.  ICE to the affected  hip every three hours for 30 minutes at a time and then as needed for pain and swelling. Continue to use ice on the hip for pain and swelling from surgery. You may notice swelling that will progress down to the foot and ankle.  This is normal after surgery.  Elevate the leg when you are not up walking on it.   It is important for you to complete the blood thinner medication as prescribed by your doctor.  Continue to use the breathing machine which will help keep your temperature down.  It is common for your temperature to cycle up and down following surgery, especially at night when you are not up moving around and exerting yourself.  The breathing machine keeps your lungs expanded and your temperature down.  RANGE OF MOTION AND STRENGTHENING EXERCISES  These exercises are designed to help you keep full movement of your hip joint. Follow your caregiver's or physical therapist's instructions. Perform all exercises about fifteen times, three times per day or as directed. Exercise both hips, even if you have had only one joint replacement. These exercises can be done on a training (exercise) mat, on the floor, on a table or on a bed. Use whatever works the best and is most comfortable for you. Use music or television while you are exercising so that the exercises are a pleasant break in your day. This will make your life better with the exercises acting as a break in routine you  can look forward to.  Lying on your back, slowly slide your foot toward your buttocks, raising your knee up off the floor. Then slowly slide your foot back down until your leg is straight again.  Lying on your back spread your legs as far apart as you can without causing discomfort.  Lying on your side, raise your upper leg and foot straight up from the floor as far as is comfortable. Slowly lower the leg and repeat.  Lying on your back, tighten up the muscle in the front of your thigh (quadriceps muscles). You can do this by keeping  your leg straight and trying to raise your heel off the floor. This helps strengthen the largest muscle supporting your knee.  Lying on your back, tighten up the muscles of your buttocks both with the legs straight and with the knee bent at a comfortable angle while keeping your heel on the floor.   SKILLED REHAB INSTRUCTIONS: If the patient is transferred to a skilled rehab facility following release from the hospital, a list of the current medications will be sent to the facility for the patient to continue.  When discharged from the skilled rehab facility, please have the facility set up the patient's Home Health Physical Therapy prior to being released. Also, the skilled facility will be responsible for providing the patient with their medications at time of release from the facility to include their pain medication and their blood thinner medication. If the patient is still at the rehab facility at time of the two week follow up appointment, the skilled rehab facility will also need to assist the patient in arranging follow up appointment in our office and any transportation needs.  MAKE SURE YOU:  Understand these instructions.  Will watch your condition.  Will get help right away if you are not doing well or get worse.  Pick up stool softner and laxative for home use following surgery while on pain medications. Daily dry dressing changes as needed. In 4 days, you may remove your dressings and begin taking showers - no tub baths or soaking the incisions. Continue to use ice for pain and swelling after surgery. Do not use any lotions or creams on the incision until instructed by your surgeon. Touch Down Weight Bearing Right Leg. No active abduction for 6 weeks.

## 2016-08-10 NOTE — Progress Notes (Signed)
Attempted to call [facility].  They stated patient is not showing up in the system yet.  I will call back.

## 2016-08-10 NOTE — Progress Notes (Signed)
   Subjective:  Patient reports pain as mild to moderate.    Objective:   VITALS:   Vitals:   08/09/16 1434 08/09/16 1714 08/09/16 2108 08/10/16 0458  BP: 130/64  118/60 137/71  Pulse: 80  81 76  Resp: 16  16 16   Temp: 97.4 F (36.3 C)  98.7 F (37.1 C) 97.6 F (36.4 C)  TempSrc:   Oral Oral  SpO2: 95% 96% 99% 97%  Weight:    53.8 kg (118 lb 9.6 oz)  Height:        NAD ABD soft Sensation intact distally Intact pulses distally Dorsiflexion/Plantar flexion intact Incision: dressing C/D/I Compartment soft   Lab Results  Component Value Date   WBC 6.9 08/09/2016   HGB 7.4 (L) 08/09/2016   HCT 22.5 (L) 08/09/2016   MCV 92.2 08/09/2016   PLT 280 08/09/2016   BMET    Component Value Date/Time   NA 133 (L) 08/09/2016 0441   K 3.8 08/09/2016 0441   CL 98 (L) 08/09/2016 0441   CO2 26 08/09/2016 0441   GLUCOSE 84 08/09/2016 0441   BUN 19 08/09/2016 0441   CREATININE 0.38 (L) 08/09/2016 0441   CALCIUM 7.8 (L) 08/09/2016 0441   GFRNONAA >60 08/09/2016 0441   GFRAA >60 08/09/2016 0441     Assessment/Plan: 4 Days Post-Op   Active Problems:   Altered mental state   Asthma   Essential hypertension   Osteoporosis   Bell's palsy   Parkinson disease (HCC)   Closed right hip fracture (HCC)   Malnutrition of moderate degree   Acute delirium   Closed pertrochanteric fracture of femur with malunion, right   Pressure injury of skin   Postoperative anemia due to acute blood loss   TDWB LLE with walker DVT ppx: lovenox x30 days, SCDs, TEDs PT/OT PO pain control Dispo: SNF placement   Almena Hokenson, Cloyde ReamsBrian James 08/10/2016, 8:12 AM   Samson FredericBrian Lanell Carpenter, MD Cell [phone]

## 2016-08-10 NOTE — Care Management Important Message (Signed)
Important Message  Patient Details  Name: Patricia Hopkins MRN: [id number] Date of Birth: 11/19/1931   Medicare Important Message Given:  Yes    Joel Mericle 08/10/2016, 1:08 PM

## 2016-08-10 NOTE — Progress Notes (Signed)
[facility].  Talked to ArcadiaGina, Charity fundraiserN. Patient is still waiting for transport to pick her up.

## 2016-08-10 NOTE — Clinical Social Work Note (Addendum)
Clinical Social Worker facilitated patient discharge including contacting patient family and facility to confirm patient discharge plans.  Clinical information faxed to facility and family agreeable with plan.  CSW arranged [facility] transport [facility]  RN to call for report prior to discharge. Patient is going to room 301B.  Clinical Social Worker will sign off for now as social work intervention is no longer needed. Please consult us again if new need arises.  [address]Bridget Mayton, [place] [phone]

## 2016-08-10 NOTE — Progress Notes (Signed)
Attempted calling [facility] again.  Was placed on hold for 10 minutes and no one came to the phone.  I will attempt to call report again before patient leaves.

## 2016-08-11 ENCOUNTER — Non-Acute Institutional Stay (SKILLED_NURSING_FACILITY): Payer: Medicare Other | Admitting: Adult Health

## 2016-08-11 DIAGNOSIS — G9340 Encephalopathy, unspecified: Secondary | ICD-10-CM | POA: Diagnosis not present

## 2016-08-11 DIAGNOSIS — K5901 Slow transit constipation: Secondary | ICD-10-CM | POA: Diagnosis not present

## 2016-08-11 DIAGNOSIS — J45909 Unspecified asthma, uncomplicated: Secondary | ICD-10-CM

## 2016-08-11 DIAGNOSIS — G510 Bell's palsy: Secondary | ICD-10-CM | POA: Diagnosis not present

## 2016-08-11 DIAGNOSIS — L89150 Pressure ulcer of sacral region, unstageable: Secondary | ICD-10-CM

## 2016-08-11 DIAGNOSIS — L89100 Pressure ulcer of unspecified part of back, unstageable: Secondary | ICD-10-CM

## 2016-08-11 DIAGNOSIS — E871 Hypo-osmolality and hyponatremia: Secondary | ICD-10-CM

## 2016-08-11 DIAGNOSIS — I10 Essential (primary) hypertension: Secondary | ICD-10-CM | POA: Diagnosis not present

## 2016-08-11 DIAGNOSIS — S72001S Fracture of unspecified part of neck of right femur, sequela: Secondary | ICD-10-CM | POA: Diagnosis not present

## 2016-08-11 DIAGNOSIS — R531 Weakness: Secondary | ICD-10-CM

## 2016-08-11 DIAGNOSIS — G20 Parkinson's disease: Secondary | ICD-10-CM

## 2016-08-11 DIAGNOSIS — D62 Acute posthemorrhagic anemia: Secondary | ICD-10-CM

## 2016-08-11 DIAGNOSIS — I5032 Chronic diastolic (congestive) heart failure: Secondary | ICD-10-CM | POA: Diagnosis not present

## 2016-08-11 DIAGNOSIS — E440 Moderate protein-calorie malnutrition: Secondary | ICD-10-CM

## 2016-08-11 NOTE — Progress Notes (Signed)
DATE:  08/11/2016   MRN:  [id number]  BIRTHDAY: 03-23-1932  Facility:  Nursing Home Location:  [facility]  Nursing Home Room Number: 301-B  LEVEL OF CARE:  SNF (31)  Contact Information    Name Relation Home Work [employer]   Patricia Hopkins,Patricia Hopkins Daughter [phone]         Code Status History    Date Active Date Inactive Code Status Order ID Comments User Context   08/02/2016  2:47 PM 08/11/2016  1:56 AM DNR [id number]  Narda Bondsalph A Nettey, MD Inpatient   08/01/2016  5:23 PM 08/02/2016  2:47 PM Full Code [id number]  Narda Bondsalph A Nettey, MD Inpatient    Questions for Most Recent Historical Code Status (Order [id number])    Question Answer Comment   In the event of cardiac or respiratory ARREST Do not call a "code blue"    In the event of cardiac or respiratory ARREST Do not perform Intubation, CPR, defibrillation or ACLS    In the event of cardiac or respiratory ARREST Use medication by any route, position, wound care, and other measures to relive pain and suffering. May use oxygen, suction and manual treatment of airway obstruction as needed for comfort.        Chief Complaint  Patient presents with  . Hospitalization Follow-up    HISTORY OF PRESENT ILLNESS:  This is an 83 year old female who has [facility] with hospitalization date 08/01/16 to 08/10/16. She has PMH of Parkinson's disease, asthma, Bell's palsy, essential hypertension and osteoporosis. She had a fall for which she sustained right hip fracture. She underwent closed reduction and attempted open reduction of hip fracture on 1/30 but orthopedics recommended another surgery. She had right intramedullary femoral nail on 2/02. She had delirium which was thought to be from hx of Parkinson's disease. CT head no acute intracranial process. MRI head negative. Chest x-ray negative and urine culture no growth.  She has been admitted for a short-term rehabilitation.  PAST  MEDICAL HISTORY:  Past Medical History:  Diagnosis Date  . Arthritis   . Asthma   . Bell's palsy 1959   R side  . Chronic kidney disease   . Fatty liver   . Hypertension   . Macular degeneration   . Osteoporosis   . Parkinson disease (HCC)      CURRENT MEDICATIONS: Reviewed  Patient's Medications  New Prescriptions   No medications on file  Previous Medications   ACETAMINOPHEN (TYLENOL) 325 MG TABLET    Take 2 tablets (650 mg total) by mouth every 6 (six) hours as needed for mild pain (or Fever >/= 101).   CARBIDOPA-LEVODOPA (SINEMET IR) 25-100 MG TABLET    TAKE 1 TABLET BY MOUTH THREE TIMES DAILY   DOCUSATE SODIUM (COLACE) 100 MG CAPSULE    Take 1 capsule (100 mg total) by mouth 2 (two) times daily.   ENOXAPARIN (LOVENOX) 30 MG/0.3ML INJECTION    Inject 0.3 mLs (30 mg total) into the skin daily.   IPRATROPIUM (ATROVENT) 0.02 % NEBULIZER SOLUTION    Take 500 mcg by nebulization every 4 (four) hours as needed for wheezing or shortness of breath.    IPRATROPIUM-ALBUTEROL (DUONEB) 0.5-2.5 (3) MG/3ML SOLN    Take 3 mLs by nebulization every 6 (six) hours as needed.   POLYETHYLENE GLYCOL (MIRALAX) PACKET    Take 17 g by mouth daily.   TRAMADOL-ACETAMINOPHEN (ULTRACET) 37.5-325 MG TABLET    Take 1  tablet by mouth every 6 (six) hours as needed.   VITAMIN B-12 (CYANOCOBALAMIN) 1000 MCG TABLET    Take 1,000 mcg by mouth daily.  Modified Medications   No medications on file  Discontinued Medications   FEEDING SUPPLEMENT, ENSURE ENLIVE, (ENSURE ENLIVE) LIQD    Take 237 mLs by mouth 3 (three) times daily between meals.     Allergies  Allergen Reactions  . Fosamax [Alendronate Sodium] Nausea Only    Has taken Reclast  One other time did not have any problems  . Penicillins Other (See Comments)    UNSPECIFIED REACTION   Has patient had a PCN reaction causing immediate rash, facial/tongue/throat swelling, SOB or lightheadedness with hypotension: unknown Has patient had a PCN reaction  causing severe rash involving mucus membranes or skin necrosis:unknown Has patient had a PCN reaction that required hospitalization : unknown Has patient had a PCN reaction occurring within the last 10 years: no If all of the above answers are "NO", then may proceed with Cephalosporin use.   . Crestor [Rosuvastatin] Other (See Comments)    NO ENERGY  . Pravachol [Pravastatin Sodium] Other (See Comments)    NO ENERGY  . Zocor [Simvastatin] Other (See Comments)    NO ENERGY     REVIEW OF SYSTEMS:  Unable to obtain due to confusion likely from Parkinson's disease    PHYSICAL EXAMINATION  GENERAL APPEARANCE: Well nourished. In no acute distress. Normal body habitus SKIN:  Right hip surgical site with aquacel dressing , lower right thigh surgical site with 3 staples, no erythema with slight clear drainage; midlumbar back has unstageable PU, Sacral area has unstageable PU HEAD: Normal in size and contour. No evidence of trauma EYES: Lids open and close normally. No blepharitis, entropion or ectropion. PERRL. Conjunctivae are clear and sclerae are white. Lenses are without opacity EARS: Pinnae are normal. Patient hears normal voice tunes of the examiner MOUTH and THROAT: Lips are without lesions. Oral mucosa is moist and without lesions. Tongue is normal in shape, size, and color and without lesions NECK: supple, trachea midline, no neck masses, no thyroid tenderness, no thyromegaly LYMPHATICS: no LAN in the neck, no supraclavicular LAN RESPIRATORY: breathing is even & unlabored, BS CTAB CARDIAC: RRR, no murmur,no extra heart sounds, RLE 2+ edema GI: abdomen soft, normal BS, no masses, no tenderness, no hepatomegaly, no splenomegaly EXTREMITIES:  Able to move X 4 extremities NEURO:  Slurred speech, disoriented X 3, right facial droop PSYCHIATRIC: Affect and behavior are appropriate    LABS/RADIOLOGY: Labs reviewed: Basic Metabolic Panel:  Recent Labs  52/84/13 0916 08/08/16 0324  08/09/16 0441  NA 133* 134* 133*  K 5.1 3.7 3.8  CL 97* 99* 98*  CO2 23 27 26   GLUCOSE 92 83 84  BUN 13 17 19   CREATININE 0.43* 0.40* 0.38*  CALCIUM 8.1* 8.0* 7.8*   Liver Function Tests:  Recent Labs  08/01/16 1300 08/02/16 0428  AST 24 21  ALT 14 <5*  ALKPHOS 151* 130*  BILITOT 1.0 1.2  PROT 6.5 5.8*  ALBUMIN 3.5 3.2*    Recent Labs  08/01/16 1309  AMMONIA 22   CBC:  Recent Labs  08/01/16 1300  08/08/16 0324 08/08/16 1610 08/09/16 0441 08/10/16 1148  WBC 12.7*  < > 6.7  --  6.9 9.4  NEUTROABS 10.7*  --   --   --   --   --   HGB 13.4  < > 7.1* 7.6* 7.4* 8.2*  HCT 39.5  < >  21.7* 22.4* 22.5* 24.8*  MCV 89.8  < > 91.2  --  92.2 93.6  PLT 476*  < > 299  --  280 430*  < > = values in this interval not displayed.  CBG:  Recent Labs  08/08/16 1129  GLUCAP 93      Dg Chest 2 View  Result Date: 08/01/2016 CLINICAL DATA:  83 y/o  F; confusion and weakness. EXAM: CHEST  2 VIEW COMPARISON:  07/06/2015 chest radiograph FINDINGS: Stable mild cardiomegaly. Tortuous aorta and aortic atherosclerosis with calcification. Question small hiatal hernia. A chronic right-sided rib fractures again noted. Deformity of left proximal humerus probably representing sequelae of old fracture. Linear opacity at the left lung base is probably atelectasis. No focal consolidation. No pleural effusion. IMPRESSION: Stable cardiomegaly. Mild aortic atherosclerosis. Question small hiatal hernia. Left basilar atelectasis. Electronically Signed   By: Mitzi Hansen M.D.   On: 08/01/2016 13:58   Ct Head Wo Contrast  Result Date: 08/01/2016 CLINICAL DATA:  Patient with altered level of consciousness 10 days prior. Urinary tract infection. EXAM: CT HEAD WITHOUT CONTRAST TECHNIQUE: Contiguous axial images were obtained from the base of the skull through the vertex without intravenous contrast. COMPARISON:  Brain CT 07/12/2009 FINDINGS: Brain: Ventricles and sulci are prominent compatible  with atrophy. Periventricular and subcortical white matter hypodensity compatible with chronic microvascular ischemic changes. No evidence for acute cortically based infarct, intracranial hemorrhage, mass lesion or mass-effect. Vascular: Unremarkable. Skull: Intact. Sinuses/Orbits: Paranasal sinuses and mastoid air cells are unremarkable. Orbits are unremarkable. Other: None. IMPRESSION: No acute intracranial process. Cortical atrophy and chronic microvascular ischemic changes. Electronically Signed   By: Annia Belt M.D.   On: 08/01/2016 17:07   Ct Pelvis Wo Contrast  Result Date: 07/16/2016 CLINICAL DATA:  Right hip and pelvic pain secondary to a fall out of bed 2 nights ago. History of previous left pelvic fractures in September 2016. EXAM: CT PELVIS WITHOUT CONTRAST TECHNIQUE: Multidetector CT imaging of the pelvis was performed following the standard protocol without intravenous contrast. COMPARISON:  None. FINDINGS: Musculoskeletal: There are old nonunion fractures of the left inferior and superior pubic rami. Old healed fracture of the left sacral ala. No acute fractures. No dislocations. Urinary Tract: 5.2 mm low-density lesion on the lower pole of the left kidney consistent with a cyst. The bladder appears normal. Bowel: Diverticula in the distal colon. Moderate amount of stool in the rectum. Vascular/Lymphatic: Extensive aortic atherosclerosis. No adenopathy. Reproductive:  Uterus and ovaries appear to have been removed. Other:  None IMPRESSION: No acute bone abnormality. 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 Aortic atherosclerosis. Electronically Signed   By: Francene Boyers M.D.   On: 07/16/2016 13:58   Mr Brain Wo Contrast  Result Date: 08/08/2016 CLINICAL DATA:  Increasing confusion since August 01, 2016. Recent hip pinning. History of hypertension and Parkinson's disease. EXAM: MRI HEAD WITHOUT CONTRAST TECHNIQUE: Multiplanar, multiecho pulse sequences of the  brain and surrounding structures were obtained without intravenous contrast. COMPARISON:  CT HEAD August 01, 2016 FINDINGS: BRAIN: No reduced diffusion to suggest acute ischemia. No susceptibility artifact to suggest hemorrhage. The ventricles and sulci are normal for patient's age. Patchy to confluent supratentorial white matter FLAIR T2 hyperintensities. No suspicious parenchymal signal, masses or mass effect. No abnormal extra-axial fluid collections. VASCULAR: Normal major intracranial vascular flow voids present at skull base. SKULL AND UPPER CERVICAL SPINE: No abnormal sellar expansion. No suspicious calvarial bone marrow signal. Craniocervical junction maintained. SINUSES/ORBITS: Atretic  maxillary sinuses. Trace mastoid effusions. Status post RIGHT ocular lens implant. The included ocular globes and orbital contents are non-suspicious. OTHER: Patient is edentulous. IMPRESSION: No acute intracranial process. Moderate chronic small vessel ischemic disease. Electronically Signed   By: Awilda Metro M.D.   On: 08/08/2016 01:07   Pelvis Portable  Result Date: 08/06/2016 CLINICAL DATA:  Right hip fracture EXAM: PORTABLE PELVIS 1-2 VIEWS COMPARISON:  08/03/2016 FINDINGS: Portable postoperative imaging demonstrates the proximal portion of an intramedullary right femoral nail with interlocking femoral neck fixation. Good alignment on this single supine projection. Remote healed fracture deformities of the left pubic rami. IMPRESSION: ORIF with intramedullary right femoral nail Electronically Signed   By: Ellery Plunk M.D.   On: 08/06/2016 21:51   Dg C-arm 1-60 Min-no Report  Result Date: 08/03/2016 Fluoroscopy was utilized by the requesting physician.  No radiographic interpretation.   Dg C-arm Gt 120 Min  Result Date: 08/06/2016 CLINICAL DATA:  RIGHT hip intramedullary nail. EXAM: DG C-ARM GT 120 MIN; RIGHT FEMUR 2 VIEWS FLUOROSCOPY TIME:  Fluoroscopy Time:  3 minutes and 53 seconds Radiation  Exposure Index (if provided by the fluoroscopic device): Not available Number of Acquired Spot Images: 5 COMPARISON:  Pelvic radiograph August 03, 2016 FINDINGS: Interpreting radiologist was not present at time of operation. Femur intramedullary rod and RIGHT femoral neck pinning. IMPRESSION: RIGHT femur ORIF. Electronically Signed   By: Awilda Metro M.D.   On: 08/06/2016 22:42   Dg Hip Operative Unilat W Or W/o Pelvis Right  Result Date: 08/03/2016 CLINICAL DATA:  Attempted internal fixation of right femoral fracture. Initial encounter. EXAM: OPERATIVE RIGHT HIP (WITH PELVIS IF PERFORMED) 2 VIEWS TECHNIQUE: Fluoroscopic spot image(s) were submitted for interpretation post-operatively. COMPARISON:  Right hip radiographs performed earlier today at 10:13 a.m. FINDINGS: Two fluoroscopic C-arm images are provided from the OR. The fracture could not be fully reduced for internal fixation at this time. IMPRESSION: The right femoral fracture could not be fully reduced for internal fixation at this time. Electronically Signed   By: Roanna Raider M.D.   On: 08/03/2016 20:26   Dg Hip Unilat With Pelvis 2-3 Views Right  Result Date: 08/03/2016 CLINICAL DATA:  Diffuse pain.  Fall . EXAM: DG HIP (WITH OR WITHOUT PELVIS) 2-3V RIGHT COMPARISON:  07/16/2016.  CT 07/16/2016.  Exam. FINDINGS: Angulated comminuted fracture of the right hip is noted. Deformity noted the left pubis consistent with old injury. Similar findings noted on prior exam. Diffuse osteopenia degenerative change. Peripheral vascular calcification IMPRESSION: 1. Acute right intertrochanteric hip fracture with angulation deformity. 2.  Old fractures left pubis. 3. Peripheral vascular disease . Electronically Signed   By: Maisie Fus  Register   On: 08/03/2016 10:35   Dg Hip Unilat  With Pelvis 2-3 Views Right  Result Date: 07/16/2016 CLINICAL DATA:  Pain following fall EXAM: DG HIP (WITH OR WITHOUT PELVIS) 2-3V RIGHT COMPARISON:  None. FINDINGS:  Frontal pelvis as well as frontal and lateral right hip images were obtained. There is an old healed fracture of the midportion left ischium. There is also an old healed fracture of the medial aspect of the left superior pubic ramus No acute fracture or dislocation evident. There is symmetric narrowing of both hip joints, moderate. No erosive change. IMPRESSION: Prior fractures left ischium and medial left superior pubic ramus with remodeling. No acute fracture or dislocation. Moderate symmetric narrowing of both hip joints. Electronically Signed   By: Bretta Bang III M.D.   On: 07/16/2016 12:59  Dg Femur, Min 2 Views Right  Result Date: 08/06/2016 CLINICAL DATA:  RIGHT hip intramedullary nail. EXAM: DG C-ARM GT 120 MIN; RIGHT FEMUR 2 VIEWS FLUOROSCOPY TIME:  Fluoroscopy Time:  3 minutes and 53 seconds Radiation Exposure Index (if provided by the fluoroscopic device): Not available Number of Acquired Spot Images: 5 COMPARISON:  Pelvic radiograph August 03, 2016 FINDINGS: Interpreting radiologist was not present at time of operation. Femur intramedullary rod and RIGHT femoral neck pinning. IMPRESSION: RIGHT femur ORIF. Electronically Signed   By: Awilda Metro M.D.   On: 08/06/2016 22:42   Dg Femur [place] 2 Views Right  Result Date: 08/06/2016 CLINICAL DATA:  Right hip fracture EXAM: RIGHT FEMUR PORTABLE 2 VIEW COMPARISON:  08/03/2016 FINDINGS: Portable postoperative imaging demonstrates intramedullary right femoral nail with interlocking femoral neck fixation and with a single distal interlocking screw. The alignment. No immediate complication is evident. IMPRESSION: ORIF with intramedullary femoral nail fixation. Electronically Signed   By: Ellery Plunk M.D.   On: 08/06/2016 21:52    ASSESSMENT/PLAN:  Generalized weakness - for rehabilitation, PT and OT, for therapeutic strengthening exercises; fall precautions  Right hip fracture S/P right femoral intramedullary nail - for  rehabilitation, PT and OT, for therapeutic strengthening exercises; follow-up with Dr. Linna Caprice, orthopedic surgeon, in 2 weeks; continue Lovenox 30 mg subcutaneous daily X 30 days for DVT prophylaxis; acetaminophen 325 mg give 2 tabs= 650 mg by mouth every 6 hours when necessary and tramadol-acetaminophen 37.5-325 mg 1 tab by mouth every 6 hours when necessary for pain  Acute encephalopathy - likely from history of Parkinson's disease, ammonia normal, CT head no acute intracranial process, MRI head negative, chest x-ray negative. Urine culture no growth; no fever; continue supportive care and monitor  Acute blood loss anemia  - recheck CBC Lab Results  Component Value Date   HGB 8.2 (L) 08/10/2016    Moderate protein calorie malnutrition - RD consult; continue med Pass 120 mL by mouth 3 times a day  Parkinson's disease - continue Sinemet 25-100 milligrams 1 tab by mouth 3 times a day  Chronic diastolic heart failure - EF 60-65%;  No SOB; weigh Q Mon-Wed-Fri; RLE edematous and dopplers negative for DVT  Hyponatremia - Na 133; re-check BMP; HCTZ was discontinued  Essential hypertension - on no medication; BP BID X 1 week  Asthma - no SOB; continue ipratropium bromide 0.02% 1 Neb every 4 hours when necessary  Bell's palsy - has chronic right facial droop; monitor for pain  Constipation - continue Colace 100 mg 1 capsule PO BID and Miralax 17 gm PO Q D  Pressure ulcer on sacral and midlumbar back - for wound treatment; keep skin clean and dry    Goals of care:  Short-term rehabilitation     Kenard Gower, NP [facility] [phone]

## 2016-08-12 ENCOUNTER — Encounter: Payer: Self-pay | Admitting: Adult Health

## 2016-08-12 LAB — BASIC METABOLIC PANEL
BUN: 15 mg/dL (ref 4–21)
CREATININE: 0.3 mg/dL — AB (ref 0.5–1.1)
GLUCOSE: 81 mg/dL
Potassium: 4.2 mmol/L (ref 3.4–5.3)
Sodium: 137 mmol/L (ref 137–147)

## 2016-08-12 LAB — CBC AND DIFFERENTIAL
HCT: 26 % — AB (ref 36–46)
Hemoglobin: 8.2 g/dL — AB (ref 12.0–16.0)
Neutrophils Absolute: 5 /uL
PLATELETS: 446 10*3/uL — AB (ref 150–399)
WBC: 7.5 10*3/mL

## 2016-08-24 ENCOUNTER — Non-Acute Institutional Stay (SKILLED_NURSING_FACILITY): Payer: Medicare Other | Admitting: Adult Health

## 2016-08-24 ENCOUNTER — Encounter: Payer: Self-pay | Admitting: Adult Health

## 2016-08-24 DIAGNOSIS — S72001S Fracture of unspecified part of neck of right femur, sequela: Secondary | ICD-10-CM

## 2016-08-24 DIAGNOSIS — D62 Acute posthemorrhagic anemia: Secondary | ICD-10-CM

## 2016-08-24 DIAGNOSIS — K5901 Slow transit constipation: Secondary | ICD-10-CM

## 2016-08-24 DIAGNOSIS — G20 Parkinson's disease: Secondary | ICD-10-CM

## 2016-08-24 DIAGNOSIS — G510 Bell's palsy: Secondary | ICD-10-CM

## 2016-08-24 DIAGNOSIS — J45909 Unspecified asthma, uncomplicated: Secondary | ICD-10-CM | POA: Diagnosis not present

## 2016-08-24 DIAGNOSIS — I5032 Chronic diastolic (congestive) heart failure: Secondary | ICD-10-CM

## 2016-08-24 DIAGNOSIS — R531 Weakness: Secondary | ICD-10-CM | POA: Diagnosis not present

## 2016-08-24 DIAGNOSIS — I10 Essential (primary) hypertension: Secondary | ICD-10-CM

## 2016-08-24 DIAGNOSIS — L89100 Pressure ulcer of unspecified part of back, unstageable: Secondary | ICD-10-CM

## 2016-08-24 DIAGNOSIS — L89150 Pressure ulcer of sacral region, unstageable: Secondary | ICD-10-CM

## 2016-08-24 NOTE — Progress Notes (Signed)
DATE:  08/24/2016   MRN:  [id number]  BIRTHDAY: Nov 23, 1931  Facility:  Nursing Home Location:  [facility]  Nursing Home Room Number: 301-B  LEVEL OF CARE:  SNF (31)  Contact Information    Name Relation Home Work [place] Daughter [phone]         Code Status History    Date Active Date Inactive Code Status Order ID Comments User Context   08/02/2016  2:47 PM 08/11/2016  1:56 AM DNR [id number]  Narda Bonds, MD Inpatient   08/01/2016  5:23 PM 08/02/2016  2:47 PM Full Code [id number]  Narda Bonds, MD Inpatient    Questions for Most Recent Historical Code Status (Order [id number])    Question Answer Comment   In the event of cardiac or respiratory ARREST Do not call a "code blue"    In the event of cardiac or respiratory ARREST Do not perform Intubation, CPR, defibrillation or ACLS    In the event of cardiac or respiratory ARREST Use medication by any route, position, wound care, and other measures to relive pain and suffering. May use oxygen, suction and manual treatment of airway obstruction as needed for comfort.        Chief Complaint  Patient presents with  . Discharge Note    HISTORY OF PRESENT ILLNESS:  This is an 84-YO female seen for a discharge visit. She will have Home health PT, OT, CNA   She has been admitted to [facility] on 03/16/17 from [facility] with hospitalization date 08/01/16 to 08/10/16. She has PMH of Parkinson's disease, asthma, Bell's palsy, essential hypertension and osteoporosis. She had a fall for which she sustained right hip fracture. She underwent closed reduction and attempted open reduction of hip fracture on 1/30 but orthopedics recommended another surgery. She had right intramedullary femoral nail on 2/02. She had delirium which was thought to be from hx of Parkinson's disease. CT head no acute intracranial process. MRI head negative. Chest x-ray negative and urine culture no growth.  Patient was  admitted to this facility for short-term rehabilitation after the patient's recent hospitalization.  Patient has completed SNF rehabilitation and therapy has cleared the patient for discharge.   PAST MEDICAL HISTORY:  Past Medical History:  Diagnosis Date  . Arthritis   . Asthma   . Bell's palsy 1959   R side  . Chronic kidney disease   . Fatty liver   . Hypertension   . Macular degeneration   . Osteoporosis   . Parkinson disease (HCC)      CURRENT MEDICATIONS: Reviewed  Patient's Medications  New Prescriptions   No medications on file  Previous Medications   ACETAMINOPHEN (TYLENOL) 325 MG TABLET    Take 2 tablets (650 mg total) by mouth every 6 (six) hours as needed for mild pain (or Fever >/= 101).   AMLODIPINE (NORVASC) 5 MG TABLET    Take 5 mg by mouth daily as needed (If BP over 140/90).   CARBIDOPA-LEVODOPA (SINEMET IR) 25-100 MG TABLET    TAKE 1 TABLET BY MOUTH THREE TIMES DAILY   DOCUSATE SODIUM (COLACE) 100 MG CAPSULE    Take 1 capsule (100 mg total) by mouth 2 (two) times daily.   ENOXAPARIN (LOVENOX) 30 MG/0.3ML INJECTION    Inject 0.3 mLs (30 mg total) into the skin daily.   FERROUS SULFATE 325 (65 FE) MG TABLET    Take 325 mg by mouth 2 (two) times  daily with a meal.   IPRATROPIUM (ATROVENT) 0.02 % NEBULIZER SOLUTION    Take 500 mcg by nebulization every 4 (four) hours as needed for wheezing or shortness of breath.    IPRATROPIUM-ALBUTEROL (DUONEB) 0.5-2.5 (3) MG/3ML SOLN    Take 3 mLs by nebulization every 6 (six) hours as needed.   MULTIPLE VITAMINS-MINERALS (MULTIVITAMIN WITH MINERALS) TABLET    Take 1 tablet by mouth daily.   NUTRITIONAL SUPPLEMENT LIQD    Take 120 mLs by mouth 3 (three) times daily.   POLYETHYLENE GLYCOL (MIRALAX) PACKET    Take 17 g by mouth daily.   VITAMIN B-12 (CYANOCOBALAMIN) 1000 MCG TABLET    Take 1,000 mcg by mouth daily.  Modified Medications   No medications on file  Discontinued Medications   TRAMADOL-ACETAMINOPHEN (ULTRACET)  37.5-325 MG TABLET    Take 1 tablet by mouth every 6 (six) hours as needed.     Allergies  Allergen Reactions  . Fosamax [Alendronate Sodium] Nausea Only    Has taken Reclast  One other time did not have any problems  . Penicillins Other (See Comments)    UNSPECIFIED REACTION   Has patient had a PCN reaction causing immediate rash, facial/tongue/throat swelling, SOB or lightheadedness with hypotension: unknown Has patient had a PCN reaction causing severe rash involving mucus membranes or skin necrosis:unknown Has patient had a PCN reaction that required hospitalization : unknown Has patient had a PCN reaction occurring within the last 10 years: no If all of the above answers are "NO", then may proceed with Cephalosporin use.   . Crestor [Rosuvastatin] Other (See Comments)    NO ENERGY  . Pravachol [Pravastatin Sodium] Other (See Comments)    NO ENERGY  . Zocor [Simvastatin] Other (See Comments)    NO ENERGY     REVIEW OF SYSTEMS:  GENERAL: no change in appetite, no fatigue, no weight changes, no fever, chills or weakness SKIN: Denies rash, itching, wounds, ulcer sores, or nail abnormality EYES: Denies change in vision, dry eyes, eye pain, itching or discharge EARS: Denies change in hearing, ringing in ears, or earache NOSE: Denies nasal congestion or epistaxis MOUTH and THROAT: Denies oral discomfort, gingival pain or bleeding, pain from teeth or hoarseness   RESPIRATORY: no cough, SOB, DOE, wheezing, hemoptysis CARDIAC: no chest pain, or palpitations GI: no abdominal pain, diarrhea, constipation, heart burn, nausea or vomiting GU: Denies dysuria, frequency, hematuria, incontinence, or discharge PSYCHIATRIC: Denies feeling of depression or anxiety. No report of hallucinations, insomnia, paranoia, or agitation     PHYSICAL EXAMINATION  GENERAL APPEARANCE: Well nourished. In no acute distress. Normal body habitus SKIN:  Right hip surgical incision with dry dressing, with  slight serous drainage; sacral pressure ulcer, unstageable HEAD: Normal in size and contour. No evidence of trauma EYES: Lids open and close normally. No blepharitis, entropion or ectropion. PERRL. Conjunctivae are clear and sclerae are white. Lenses are without opacity EARS: Pinnae are normal. Patient hears normal voice tunes of the examiner MOUTH and THROAT: Lips are without lesions. Oral mucosa is moist and without lesions. Tongue is normal in shape, size, and color and without lesions NECK: supple, trachea midline, no neck masses, no thyroid tenderness, no thyromegaly LYMPHATICS: no LAN in the neck, no supraclavicular LAN RESPIRATORY: breathing is even & unlabored, BS CTAB CARDIAC: RRR, no murmur,no extra heart sounds, BLE 2+ edema GI: abdomen soft, normal BS, no masses, no tenderness, no hepatomegaly, no splenomegaly EXTREMITIES:  Able to move X 4 extremities PSYCHIATRIC: Alert to  self, disoriented to time and place. Affect and behavior are appropriate    LABS/RADIOLOGY: Labs reviewed: Basic Metabolic Panel:  Recent Labs  16/10/96 0916 08/08/16 0324 08/09/16 0441 08/12/16  NA 133* 134* 133* 137  K 5.1 3.7 3.8 4.2  CL 97* 99* 98*  --   CO2 23 27 26   --   GLUCOSE 92 83 84  --   BUN 13 17 19 15   CREATININE 0.43* 0.40* 0.38* 0.3*  CALCIUM 8.1* 8.0* 7.8*  --    Liver Function Tests:  Recent Labs  08/01/16 1300 08/02/16 0428  AST 24 21  ALT 14 <5*  ALKPHOS 151* 130*  BILITOT 1.0 1.2  PROT 6.5 5.8*  ALBUMIN 3.5 3.2*    Recent Labs  08/01/16 1309  AMMONIA 22   CBC:  Recent Labs  08/01/16 1300  08/08/16 0324  08/09/16 0441 08/10/16 1148 08/12/16  WBC 12.7*  < > 6.7  --  6.9 9.4 7.5  NEUTROABS 10.7*  --   --   --   --   --  5  HGB 13.4  < > 7.1*  < > 7.4* 8.2* 8.2*  HCT 39.5  < > 21.7*  < > 22.5* 24.8* 26*  MCV 89.8  < > 91.2  --  92.2 93.6  --   PLT 476*  < > 299  --  280 430* 446*  < > = values in this interval not displayed.  CBG:  Recent Labs   08/08/16 1129  GLUCAP 93    Dg Chest 2 View  Result Date: 08/01/2016 CLINICAL DATA:  83 y/o  F; confusion and weakness. EXAM: CHEST  2 VIEW COMPARISON:  07/06/2015 chest radiograph FINDINGS: Stable mild cardiomegaly. Tortuous aorta and aortic atherosclerosis with calcification. Question small hiatal hernia. A chronic right-sided rib fractures again noted. Deformity of left proximal humerus probably representing sequelae of old fracture. Linear opacity at the left lung base is probably atelectasis. No focal consolidation. No pleural effusion. IMPRESSION: Stable cardiomegaly. Mild aortic atherosclerosis. Question small hiatal hernia. Left basilar atelectasis. Electronically Signed   By: Mitzi Hansen M.D.   On: 08/01/2016 13:58   Ct Head Wo Contrast  Result Date: 08/01/2016 CLINICAL DATA:  Patient with altered level of consciousness 10 days prior. Urinary tract infection. EXAM: CT HEAD WITHOUT CONTRAST TECHNIQUE: Contiguous axial images were obtained from the base of the skull through the vertex without intravenous contrast. COMPARISON:  Brain CT 07/12/2009 FINDINGS: Brain: Ventricles and sulci are prominent compatible with atrophy. Periventricular and subcortical white matter hypodensity compatible with chronic microvascular ischemic changes. No evidence for acute cortically based infarct, intracranial hemorrhage, mass lesion or mass-effect. Vascular: Unremarkable. Skull: Intact. Sinuses/Orbits: Paranasal sinuses and mastoid air cells are unremarkable. Orbits are unremarkable. Other: None. IMPRESSION: No acute intracranial process. Cortical atrophy and chronic microvascular ischemic changes. Electronically Signed   By: Annia Belt M.D.   On: 08/01/2016 17:07   Mr Brain Wo Contrast  Result Date: 08/08/2016 CLINICAL DATA:  Increasing confusion since August 01, 2016. Recent hip pinning. History of hypertension and Parkinson's disease. EXAM: MRI HEAD WITHOUT CONTRAST TECHNIQUE: Multiplanar,  multiecho pulse sequences of the brain and surrounding structures were obtained without intravenous contrast. COMPARISON:  CT HEAD August 01, 2016 FINDINGS: BRAIN: No reduced diffusion to suggest acute ischemia. No susceptibility artifact to suggest hemorrhage. The ventricles and sulci are normal for patient's age. Patchy to confluent supratentorial white matter FLAIR T2 hyperintensities. No suspicious parenchymal signal, masses or mass effect. No abnormal  extra-axial fluid collections. VASCULAR: Normal major intracranial vascular flow voids present at skull base. SKULL AND UPPER CERVICAL SPINE: No abnormal sellar expansion. No suspicious calvarial bone marrow signal. Craniocervical junction maintained. SINUSES/ORBITS: Atretic maxillary sinuses. Trace mastoid effusions. Status post RIGHT ocular lens implant. The included ocular globes and orbital contents are non-suspicious. OTHER: Patient is edentulous. IMPRESSION: No acute intracranial process. Moderate chronic small vessel ischemic disease. Electronically Signed   By: Awilda Metro M.D.   On: 08/08/2016 01:07   Pelvis Portable  Result Date: 08/06/2016 CLINICAL DATA:  Right hip fracture EXAM: PORTABLE PELVIS 1-2 VIEWS COMPARISON:  08/03/2016 FINDINGS: Portable postoperative imaging demonstrates the proximal portion of an intramedullary right femoral nail with interlocking femoral neck fixation. Good alignment on this single supine projection. Remote healed fracture deformities of the left pubic rami. IMPRESSION: ORIF with intramedullary right femoral nail Electronically Signed   By: Ellery Plunk M.D.   On: 08/06/2016 21:51   Dg C-arm 1-60 Min-no Report  Result Date: 08/03/2016 Fluoroscopy was utilized by the requesting physician.  No radiographic interpretation.   Dg C-arm Gt 120 Min  Result Date: 08/06/2016 CLINICAL DATA:  RIGHT hip intramedullary nail. EXAM: DG C-ARM GT 120 MIN; RIGHT FEMUR 2 VIEWS FLUOROSCOPY TIME:  Fluoroscopy Time:  3  minutes and 53 seconds Radiation Exposure Index (if provided by the fluoroscopic device): Not available Number of Acquired Spot Images: 5 COMPARISON:  Pelvic radiograph August 03, 2016 FINDINGS: Interpreting radiologist was not present at time of operation. Femur intramedullary rod and RIGHT femoral neck pinning. IMPRESSION: RIGHT femur ORIF. Electronically Signed   By: Awilda Metro M.D.   On: 08/06/2016 22:42   Dg Hip Operative Unilat W Or W/o Pelvis Right  Result Date: 08/03/2016 CLINICAL DATA:  Attempted internal fixation of right femoral fracture. Initial encounter. EXAM: OPERATIVE RIGHT HIP (WITH PELVIS IF PERFORMED) 2 VIEWS TECHNIQUE: Fluoroscopic spot image(s) were submitted for interpretation post-operatively. COMPARISON:  Right hip radiographs performed earlier today at 10:13 a.m. FINDINGS: Two fluoroscopic C-arm images are provided from the OR. The fracture could not be fully reduced for internal fixation at this time. IMPRESSION: The right femoral fracture could not be fully reduced for internal fixation at this time. Electronically Signed   By: Roanna Raider M.D.   On: 08/03/2016 20:26   Dg Hip Unilat With Pelvis 2-3 Views Right  Result Date: 08/03/2016 CLINICAL DATA:  Diffuse pain.  Fall . EXAM: DG HIP (WITH OR WITHOUT PELVIS) 2-3V RIGHT COMPARISON:  07/16/2016.  CT 07/16/2016.  Exam. FINDINGS: Angulated comminuted fracture of the right hip is noted. Deformity noted the left pubis consistent with old injury. Similar findings noted on prior exam. Diffuse osteopenia degenerative change. Peripheral vascular calcification IMPRESSION: 1. Acute right intertrochanteric hip fracture with angulation deformity. 2.  Old fractures left pubis. 3. Peripheral vascular disease . Electronically Signed   By: Maisie Fus  Register   On: 08/03/2016 10:35   Dg Femur, Min 2 Views Right  Result Date: 08/06/2016 CLINICAL DATA:  RIGHT hip intramedullary nail. EXAM: DG C-ARM GT 120 MIN; RIGHT FEMUR 2 VIEWS  FLUOROSCOPY TIME:  Fluoroscopy Time:  3 minutes and 53 seconds Radiation Exposure Index (if provided by the fluoroscopic device): Not available Number of Acquired Spot Images: 5 COMPARISON:  Pelvic radiograph August 03, 2016 FINDINGS: Interpreting radiologist was not present at time of operation. Femur intramedullary rod and RIGHT femoral neck pinning. IMPRESSION: RIGHT femur ORIF. Electronically Signed   By: Awilda Metro M.D.   On: 08/06/2016 22:42  [place]g [place] Views Right  Result Date: 08/06/2016 CLINICAL DATA:  Right hip fracture EXAM: RIGHT FEMUR PORTABLE 2 VIEW COMPARISON:  08/03/2016 FINDINGS: Portable postoperative imaging demonstrates intramedullary right femoral nail with interlocking femoral neck fixation and with a single distal interlocking screw. The alignment. No immediate complication is evident. IMPRESSION: ORIF with intramedullary femoral nail fixation. Electronically Signed   By: Ellery Plunkaniel R Mitchell M.D.   On: 08/06/2016 21:52    ASSESSMENT/PLAN:  Generalized weakness - for Home health PT and OT, for therapeutic strengthening exercises; fall precautions  Right hip fracture S/P right femoral intramedullary nail - for Home health PT and OT, for therapeutic strengthening exercises; follow-up with Dr. Linna CapriceSwinteck, orthopedic surgeon ; continue Lovenox 30 mg subcutaneous daily X total of 30 days (till 09/07/16) for DVT prophylaxis; acetaminophen 325 mg give 2 tabs= 650 mg by mouth every 6 hours when necessary for pain  Acute blood loss anemia  - recently started on FeSO4 325 mg  1 tab PO BID Lab Results  Component Value Date   HGB 8.2 (A) 08/12/2016    Parkinson's disease - continue Sinemet 25-100 mg 1 tab by mouth 3 times a day  Chronic diastolic heart failure - EF 60-65%;  No SOB  Essential hypertension - continue Norvasc 5 mg 1 tab PO Q D  Asthma - no SOB; continue ipratropium bromide 0.02% 1 Neb every 4 hours when necessary  Bell's palsy - has chronic right facial  droop; monitor for pain  Constipation - continue Colace 100 mg 1 capsule PO BID and Miralax 17 gm PO Q D  Pressure ulcer on sacral and midlumbar back, unstageable - continue wound treatment; keep skin clean and dry     I have filled out patient's discharge paperwork and written prescriptions.  Patient will receive home health PT, OT, SW, Nursing and CNA.  DME provided:  None  Total discharge time: Greater than 30 minutes Greater than 50% was spent in counseling and coordination of care.   Discharge time involved coordination of the discharge process with social worker, nursing staff and therapy department. Medical justification for home health services verified.     Reed Eifert C. Medina-Vargas - NP  [facility] [phone]

## 2016-09-09 ENCOUNTER — Ambulatory Visit: Payer: Medicare Other | Admitting: Neurology

## 2016-09-16 ENCOUNTER — Ambulatory Visit: Payer: Medicare Other | Admitting: Neurology

## 2016-10-26 ENCOUNTER — Ambulatory Visit: Payer: Medicare Other | Admitting: Neurology

## 2016-11-02 DEATH — deceased

## 2016-12-04 NOTE — Addendum Note (Signed)
Addendum  created 12/04/16 0900 by Deashia Soule, MD   Sign clinical note    

## 2016-12-10 NOTE — Addendum Note (Signed)
Addendum  created 12/10/16 0943 by Billiejean Schimek, MD   Sign clinical note    

## 2018-07-14 IMAGING — DX DG SHOULDER 2+V*L*
3 series · 3 of 3 positions shown · non-contrast
Comparison: None.

CLINICAL DATA: Fall today with injury to left shoulder.

EXAM:
LEFT SHOULDER - 2+ VIEW

[shoulder grashey]
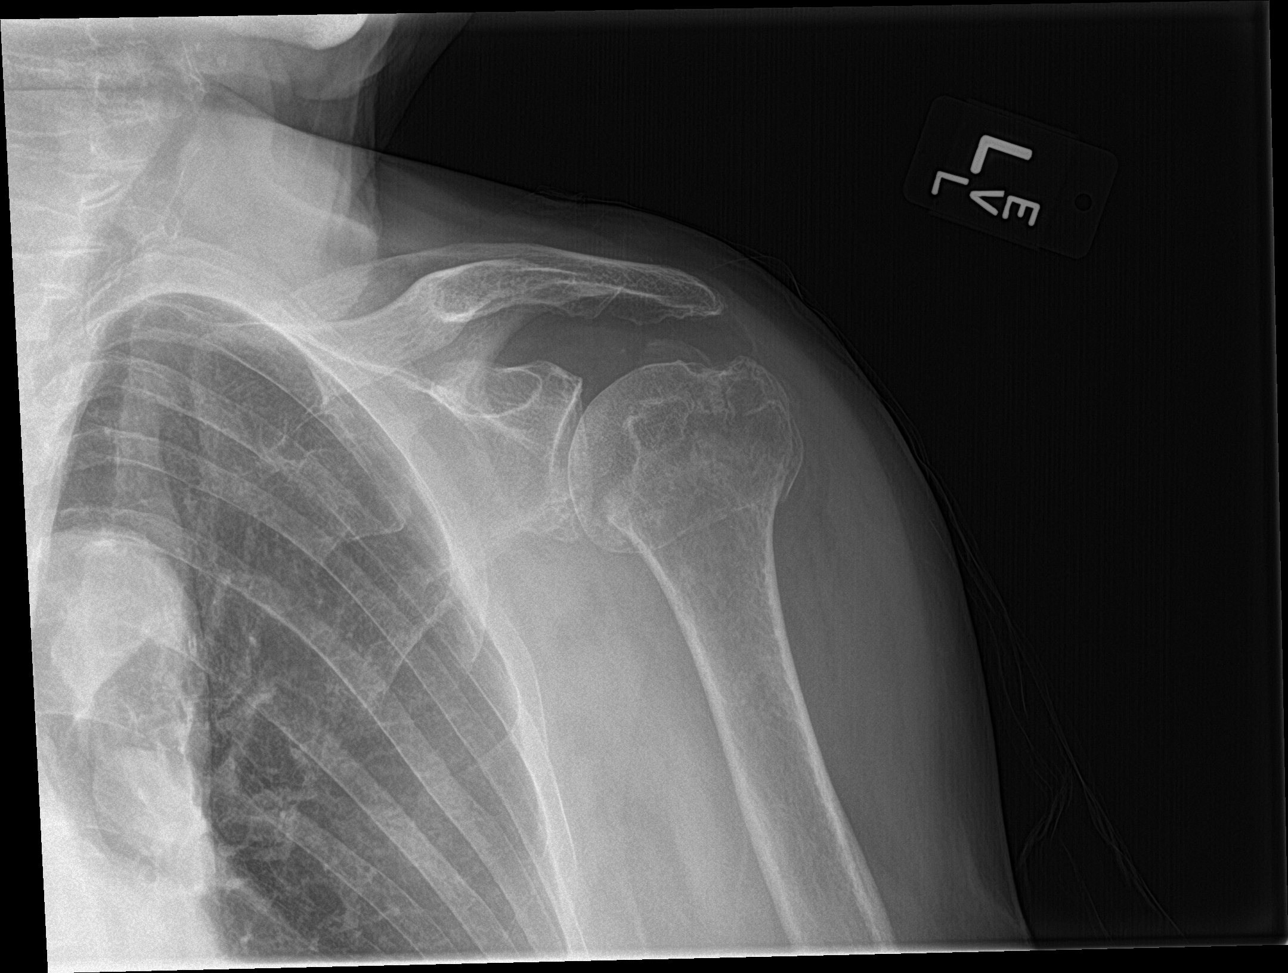

[shoulder y view]
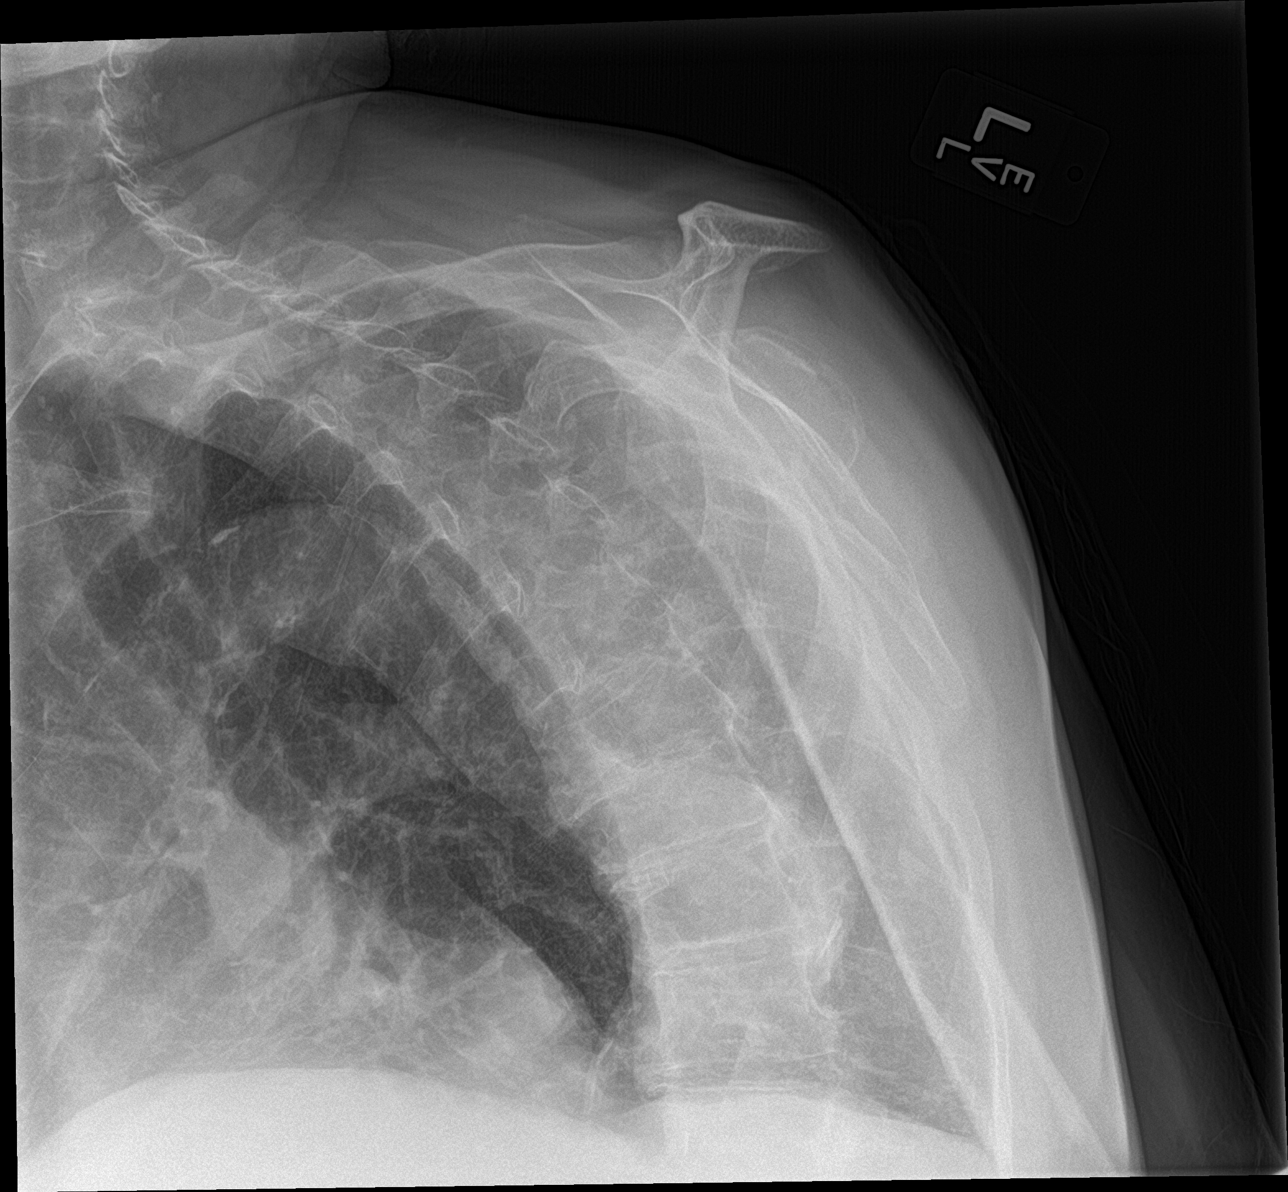

[shoulder ap neutral]
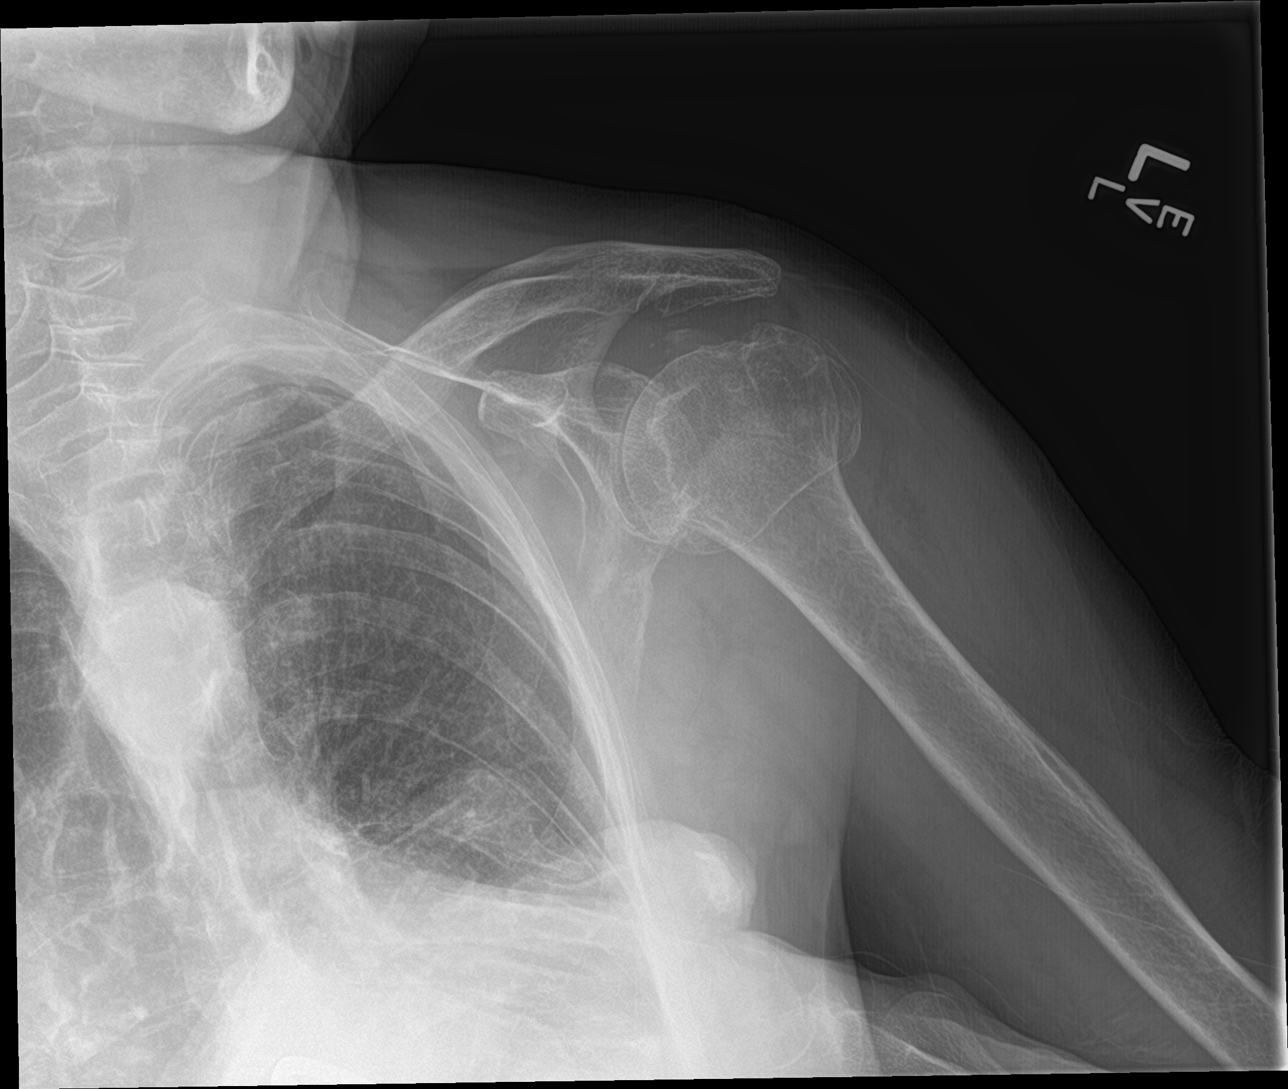

[3 of 3 positions shown; findings below may reference images not displayed]

FINDINGS: Three-view exam left shoulder shows a comminuted humeral head
fracture with a probable degree of impaction. The acromioclavicular
and coracoclavicular distances are preserved. Bones are diffusely
demineralized.
IMPRESSION: Comminuted fracture humeral head.
